# Patient Record
Sex: Male | Born: 1960 | Race: Black or African American | Hispanic: No | Smoking: Current every day smoker
Health system: Southern US, Community
[De-identification: ages and names within clinical notes are randomized; demographics above are authoritative.]

## PROBLEM LIST (undated history)

## (undated) DIAGNOSIS — G4730 Sleep apnea, unspecified: Secondary | ICD-10-CM

## (undated) DIAGNOSIS — F32A Depression, unspecified: Secondary | ICD-10-CM

## (undated) DIAGNOSIS — M1990 Unspecified osteoarthritis, unspecified site: Secondary | ICD-10-CM

## (undated) DIAGNOSIS — F419 Anxiety disorder, unspecified: Secondary | ICD-10-CM

## (undated) DIAGNOSIS — E785 Hyperlipidemia, unspecified: Secondary | ICD-10-CM

## (undated) DIAGNOSIS — I10 Essential (primary) hypertension: Secondary | ICD-10-CM

## (undated) DIAGNOSIS — K219 Gastro-esophageal reflux disease without esophagitis: Secondary | ICD-10-CM

## (undated) HISTORY — PX: COLONOSCOPY: SHX174

## (undated) HISTORY — DX: Essential (primary) hypertension: I10

## (undated) HISTORY — DX: Anxiety disorder, unspecified: F41.9

## (undated) HISTORY — DX: Depression, unspecified: F32.A

## (undated) HISTORY — DX: Unspecified osteoarthritis, unspecified site: M19.90

## (undated) HISTORY — DX: Sleep apnea, unspecified: G47.30

## (undated) HISTORY — DX: Gastro-esophageal reflux disease without esophagitis: K21.9

## (undated) HISTORY — DX: Hyperlipidemia, unspecified: E78.5

## (undated) HISTORY — PX: OTHER SURGICAL HISTORY: SHX169

---

## 2002-04-13 ENCOUNTER — Emergency Department (HOSPITAL_COMMUNITY): Admission: EM | Admit: 2002-04-13 | Discharge: 2002-04-13 | Payer: Self-pay | Admitting: Emergency Medicine

## 2004-11-18 ENCOUNTER — Emergency Department (HOSPITAL_COMMUNITY): Admission: EM | Admit: 2004-11-18 | Discharge: 2004-11-18 | Payer: Self-pay | Admitting: Emergency Medicine

## 2005-10-25 ENCOUNTER — Emergency Department (HOSPITAL_COMMUNITY): Admission: EM | Admit: 2005-10-25 | Discharge: 2005-10-25 | Payer: Self-pay | Admitting: Family Medicine

## 2008-10-17 ENCOUNTER — Emergency Department (HOSPITAL_COMMUNITY): Admission: EM | Admit: 2008-10-17 | Discharge: 2008-10-17 | Payer: Self-pay | Admitting: Emergency Medicine

## 2009-02-07 ENCOUNTER — Encounter: Admission: RE | Admit: 2009-02-07 | Discharge: 2009-02-07 | Payer: Self-pay | Admitting: Family Medicine

## 2009-02-23 ENCOUNTER — Encounter: Payer: Self-pay | Admitting: Internal Medicine

## 2009-02-23 ENCOUNTER — Ambulatory Visit (HOSPITAL_BASED_OUTPATIENT_CLINIC_OR_DEPARTMENT_OTHER): Admission: RE | Admit: 2009-02-23 | Discharge: 2009-02-23 | Payer: Self-pay | Admitting: Family Medicine

## 2009-02-26 ENCOUNTER — Ambulatory Visit: Payer: Self-pay | Admitting: Internal Medicine

## 2009-03-19 ENCOUNTER — Emergency Department (HOSPITAL_COMMUNITY): Admission: EM | Admit: 2009-03-19 | Discharge: 2009-03-19 | Payer: Self-pay | Admitting: Emergency Medicine

## 2009-05-20 ENCOUNTER — Ambulatory Visit: Payer: Self-pay | Admitting: Internal Medicine

## 2009-05-20 DIAGNOSIS — E785 Hyperlipidemia, unspecified: Secondary | ICD-10-CM | POA: Insufficient documentation

## 2009-05-20 DIAGNOSIS — G4733 Obstructive sleep apnea (adult) (pediatric): Secondary | ICD-10-CM | POA: Insufficient documentation

## 2009-05-20 DIAGNOSIS — I10 Essential (primary) hypertension: Secondary | ICD-10-CM | POA: Insufficient documentation

## 2009-09-06 ENCOUNTER — Ambulatory Visit: Payer: Self-pay | Admitting: Internal Medicine

## 2010-01-23 ENCOUNTER — Encounter: Admission: RE | Admit: 2010-01-23 | Discharge: 2010-03-28 | Payer: Self-pay | Admitting: Unknown Physician Specialty

## 2011-05-01 NOTE — Procedures (Signed)
NAME:  Scott Schaefer, Scott Schaefer             ACCOUNT NO.:  [id number]   MEDICAL RECORD NO.:  [id number]          PATIENT TYPE:  OUT   LOCATION:  SLEEP CENTER                 FACILITY:  [facility]   PHYSICIAN:  Clinton D. Maple Hudson, MD, FCCP, FACPDATE OF BIRTH:  November 09, 1962   DATE OF STUDY:  02/23/2009                            NOCTURNAL POLYSOMNOGRAM   REFERRING PHYSICIAN:  Maurice March, M.D.   REFERRING PHYSICIAN:  Maurice March, MD   INDICATION FOR STUDY:  Hypersomnia with sleep apnea.   EPWORTH SLEEPINESS SCORE:  16/24, BMI 31.8.  Weight 248 pounds, height  74 inches.  Neck 18 inches.   MEDICATIONS:  Home medications are charted and reviewed.   SLEEP ARCHITECTURE:  Total sleep time 303 minutes with sleep efficiency  78.8%.  Stage I was 10.1%, stage II 77.9%, stage III absent, REM 12% of  total sleep time.  Sleep latency 18 minutes, REM latency 157 minutes,  awake after sleep onset 61 minutes, arousal index 24.8.  No bedtime  medication was taken.   RESPIRATORY DATA:  Apnea/hypopnea index (AHI) 23.2 per hour.  A total of  117 events was scored including 23 obstructive apneas and 94 hypopneas.  Events were recorded in all sleep positions, most commonly while supine.  REM AHI of 51 per hour.  This was a diagnostic and PSG study and CPAP  titration was not done.   OXYGEN DATA:  Extremely loud snoring with oxygen desaturation to a nadir  of 81%.  Mean oxygen saturation through the study was 94.4% on room air.   CARDIAC DATA:  Normal sinus rhythm.   MOVEMENT-PARASOMNIA:  No significant movement disturbance.  Bathroom x2.   IMPRESSIONS-RECOMMENDATIONS:  1. Moderately severe obstructive sleep apnea/hypopnea syndrome,      apnea/hypopnea index 23.2 per hour with events more common while      supine but recorded in all positions.  Very loud snoring with      oxygen desaturation to a nadir of 81%.  2. This was a diagnostic and polysomnogram as ordered.  Consider      return for CPAP  titration or evaluate for alternative management as      appropriate.      Clinton D. Maple Hudson, MD, [facility], FACP  Diplomate, Biomedical engineer of Sleep Medicine  Electronically Signed     CDY/MEDQ  D:  02/27/2009 11:13:55  T:  02/28/2009 04:05:19  Job:  [id number]

## 2012-02-07 ENCOUNTER — Encounter: Payer: Self-pay | Admitting: Gastroenterology

## 2012-02-14 ENCOUNTER — Encounter: Payer: Self-pay | Admitting: Gastroenterology

## 2012-02-14 ENCOUNTER — Ambulatory Visit (AMBULATORY_SURGERY_CENTER): Payer: BC Managed Care – PPO | Admitting: *Deleted

## 2012-02-14 VITALS — Ht 74.0 in | Wt 248.1 lb

## 2012-02-14 DIAGNOSIS — Z1211 Encounter for screening for malignant neoplasm of colon: Secondary | ICD-10-CM

## 2012-02-14 MED ORDER — PEG-KCL-NACL-NASULF-NA ASC-C 100 G PO SOLR: ORAL | Status: DC

## 2012-02-27 ENCOUNTER — Encounter: Payer: Self-pay | Admitting: Gastroenterology

## 2012-02-27 ENCOUNTER — Ambulatory Visit (AMBULATORY_SURGERY_CENTER): Payer: BC Managed Care – PPO | Admitting: Gastroenterology

## 2012-02-27 VITALS — BP 157/101 | HR 90 | Temp 97.3°F | Resp 17

## 2012-02-27 DIAGNOSIS — Z1211 Encounter for screening for malignant neoplasm of colon: Secondary | ICD-10-CM

## 2012-02-27 LAB — GLUCOSE, CAPILLARY: Glucose-Capillary: 123 mg/dL — ABNORMAL HIGH (ref 70–99)

## 2012-02-27 MED ORDER — SODIUM CHLORIDE 0.9 % IV SOLN: 500.0000 mL | INTRAVENOUS | Status: DC

## 2012-02-27 NOTE — Patient Instructions (Signed)

## 2012-02-27 NOTE — Op Note (Signed)
[facility] [address] [place], [place]  [address]  COLONOSCOPY PROCEDURE REPORT  PATIENT:  Scott Schaefer, Scott Schaefer  MR#:  [id number] BIRTHDATE:  Nov 29, 1961, 51 yrs. old  GENDER:  male ENDOSCOPIST:  Vania Rea. Jarold Motto, MD, [facility] REF. BY: PROCEDURE DATE:  02/27/2012 PROCEDURE:  Incomplete colonoscopy ASA CLASS:  Class III INDICATIONS:  Routine Risk Screening MEDICATIONS:   propofol (Diprivan) 350 mg IV  DESCRIPTION OF PROCEDURE:   After the risks and benefits and of the procedure were explained, informed consent was obtained. Digital rectal exam was performed and revealed no abnormalities. The LB PCF-H180AL [id number] endoscope was introduced through the anus and advanced to the splenic flexure.  The quality of the prep was poor, using MoviPrep.  The instrument was then slowly withdrawn as the colon was fully examined. <<PROCEDUREIMAGES>>  FINDINGS:  incomplete exam. NO COLON PREP DONE.PROFUSE STOOL.IRRIGATED AT LENGTH.FOUL SMELLING FECES,CANNOT EVALUATE COLON !!!   Retroflexed views in the rectum revealed not done. The scope was then withdrawn from the patient and the procedure completed.  COMPLICATIONS:  None ENDOSCOPIC IMPRESSION: 1) Incomplete exam 2) Not done RECOMMENDATIONS: "DOUBLE PREP" AND REPEAT EXAM.  REPEAT EXAM:  No  ______________________________ Vania Rea. Jarold Motto, MD, [facility]  CC:  n. eSIGNED:   Vania Rea. Batya Citron at 02/27/2012 09:59 AM  Hortencia Conradi, [id number]

## 2012-02-27 NOTE — Progress Notes (Signed)
Patient did not experience any of the following events: a burn prior to discharge; a fall within the facility; wrong site/side/patient/procedure/implant event; or a hospital transfer or hospital admission upon discharge from the facility. (G8907) Patient did not have preoperative order for IV antibiotic SSI prophylaxis. (G8918)  

## 2012-02-28 ENCOUNTER — Telehealth: Payer: Self-pay | Admitting: *Deleted

## 2012-02-28 NOTE — Telephone Encounter (Signed)
Left message

## 2012-03-04 ENCOUNTER — Telehealth: Payer: Self-pay | Admitting: *Deleted

## 2012-03-04 NOTE — Telephone Encounter (Addendum)
lmom for pt to call back. Scheduled pt for PV on 03/10/12 at 0900am and for repeat Colon on 03/17/12 at 2:30pm. Mailed pt a letter with appts.

## 2012-03-17 ENCOUNTER — Encounter: Payer: BC Managed Care – PPO | Admitting: Gastroenterology

## 2012-04-23 ENCOUNTER — Ambulatory Visit (AMBULATORY_SURGERY_CENTER): Payer: BC Managed Care – PPO | Admitting: *Deleted

## 2012-04-23 ENCOUNTER — Encounter: Payer: Self-pay | Admitting: Gastroenterology

## 2012-04-23 VITALS — Ht 74.0 in | Wt 246.2 lb

## 2012-04-23 DIAGNOSIS — Z1211 Encounter for screening for malignant neoplasm of colon: Secondary | ICD-10-CM

## 2012-04-23 MED ORDER — PEG-KCL-NACL-NASULF-NA ASC-C 100 G PO SOLR: 1.0000 | Freq: Once | ORAL | Status: DC

## 2012-05-05 ENCOUNTER — Encounter: Payer: Self-pay | Admitting: Gastroenterology

## 2012-05-05 ENCOUNTER — Ambulatory Visit (AMBULATORY_SURGERY_CENTER): Payer: BC Managed Care – PPO | Admitting: Gastroenterology

## 2012-05-05 ENCOUNTER — Emergency Department (HOSPITAL_COMMUNITY)
Admission: EM | Admit: 2012-05-05 | Discharge: 2012-05-05 | Disposition: A | Discharge status: Home / Self Care | Payer: BC Managed Care – PPO | Source: Home / Self Care

## 2012-05-05 ENCOUNTER — Encounter (HOSPITAL_COMMUNITY): Payer: Self-pay | Admitting: Emergency Medicine

## 2012-05-05 VITALS — BP 175/117 | HR 129 | Temp 96.3°F | Resp 22 | Ht 74.0 in | Wt 246.0 lb

## 2012-05-05 DIAGNOSIS — D126 Benign neoplasm of colon, unspecified: Secondary | ICD-10-CM

## 2012-05-05 DIAGNOSIS — Z1211 Encounter for screening for malignant neoplasm of colon: Secondary | ICD-10-CM

## 2012-05-05 DIAGNOSIS — M109 Gout, unspecified: Secondary | ICD-10-CM

## 2012-05-05 LAB — GLUCOSE, CAPILLARY: Glucose-Capillary: 148 mg/dL — ABNORMAL HIGH (ref 70–99)

## 2012-05-05 MED ORDER — COLCHICINE 0.6 MG PO TABS: 0.6000 mg | ORAL_TABLET | Freq: Every day | ORAL | Status: DC | PRN

## 2012-05-05 MED ORDER — SODIUM CHLORIDE 0.9 % IV SOLN: 500.0000 mL | INTRAVENOUS | Status: DC

## 2012-05-05 MED ORDER — HYDROCODONE-ACETAMINOPHEN 5-500 MG PO TABS: 1.0000 | ORAL_TABLET | Freq: Four times a day (QID) | ORAL | Status: AC | PRN

## 2012-05-05 NOTE — Progress Notes (Signed)
Propofol per [facility]. See scanned intra procedure report. ewm 

## 2012-05-05 NOTE — Patient Instructions (Signed)
Discharge instructions given with verbal understanding. Handouts on polyps given. Resume previous medications. YOU HAD AN ENDOSCOPIC PROCEDURE TODAY AT THE  ENDOSCOPY CENTER: Refer to the procedure report that was given to you for any specific questions about what was found during the examination.  If the procedure report does not answer your questions, please call your gastroenterologist to clarify.  If you requested that your care partner not be given the details of your procedure findings, then the procedure report has been included in a sealed envelope for you to review at your convenience later.  YOU SHOULD EXPECT: Some feelings of bloating in the abdomen. Passage of more gas than usual.  Walking can help get rid of the air that was put into your GI tract during the procedure and reduce the bloating. If you had a lower endoscopy (such as a colonoscopy or flexible sigmoidoscopy) you may notice spotting of blood in your stool or on the toilet paper. If you underwent a bowel prep for your procedure, then you may not have a normal bowel movement for a few days.  DIET: Your first meal following the procedure should be a light meal and then it is ok to progress to your normal diet.  A half-sandwich or bowl of soup is an example of a good first meal.  Heavy or fried foods are harder to digest and may make you feel nauseous or bloated.  Likewise meals heavy in dairy and vegetables can cause extra gas to form and this can also increase the bloating.  Drink plenty of fluids but you should avoid alcoholic beverages for 24 hours.  ACTIVITY: Your care partner should take you home directly after the procedure.  You should plan to take it easy, moving slowly for the rest of the day.  You can resume normal activity the day after the procedure however you should NOT DRIVE or use heavy machinery for 24 hours (because of the sedation medicines used during the test).    SYMPTOMS TO REPORT IMMEDIATELY: A  gastroenterologist can be reached at any hour.  During normal business hours, 8:30 AM to 5:00 PM Monday through Friday, call [phone].  After hours and on weekends, please call the GI answering service at [phone] who will take a message and have the physician on call contact you.   Following lower endoscopy (colonoscopy or flexible sigmoidoscopy):  Excessive amounts of blood in the stool  Significant tenderness or worsening of abdominal pains  Swelling of the abdomen that is new, acute  Fever of 100F or higher  FOLLOW UP: If any biopsies were taken you will be contacted by phone or by letter within the next 1-3 weeks.  Call your gastroenterologist if you have not heard about the biopsies in 3 weeks.  Our staff will call the home number listed on your records the next business day following your procedure to check on you and address any questions or concerns that you may have at that time regarding the information given to you following your procedure. This is a courtesy call and so if there is no answer at the home number and we have not heard from you through the emergency physician on call, we will assume that you have returned to your regular daily activities without incident.  SIGNATURES/CONFIDENTIALITY: You and/or your care partner have signed paperwork which will be entered into your electronic medical record.  These signatures attest to the fact that that the information above on your After Visit Summary has   been reviewed and is understood.  Full responsibility of the confidentiality of this discharge information lies with you and/or your care-partner.  

## 2012-05-05 NOTE — ED Notes (Signed)
PT HERE WITH C/O SWELLING AND THROB PAIN IN RIGHT GREAT TOE THAT STARTED Sunday.DIFF WALKING AND STANDING

## 2012-05-05 NOTE — Op Note (Signed)
[facility] [address] [place], [place]  [address]  COLONOSCOPY PROCEDURE REPORT  PATIENT:  Scott Schaefer, Scott Schaefer  MR#:  [id number] BIRTHDATE:  1961/01/24, 51 yrs. old  GENDER:  male ENDOSCOPIST:  Vania Rea. Jarold Motto, MD, [facility] REF. BY: PROCEDURE DATE:  05/05/2012 PROCEDURE:  Colonoscopy with biopsy ASA CLASS:  Class II INDICATIONS:  Routine Risk Screening MEDICATIONS:   propofol (Diprivan) 330 mg IV  DESCRIPTION OF PROCEDURE:   After the risks and benefits and of the procedure were explained, informed consent was obtained. Digital rectal exam was performed and revealed no abnormalities. The LB CF-H180AL [id number] endoscope was introduced through the anus and advanced to the cecum, which was identified by both the appendix and ileocecal valve.  The quality of the prep was good, using MoviPrep.  The instrument was then slowly withdrawn as the colon was fully examined. <<PROCEDUREIMAGES>>  FINDINGS:  A diminutive polyp was found in the sigmoid colon. 3 MM POLYP COLD BIOPSY REMOVER.SEE PICTURES  This was otherwise a normal examination of the colon.   Retroflexed views in the rectum revealed no abnormalities.    The scope was then withdrawn from the patient and the procedure completed.  COMPLICATIONS:  None ENDOSCOPIC IMPRESSION: 1) Diminutive polyp in the sigmoid colon 2) Otherwise normal examination R/O ADENOMA RECOMMENDATIONS: 1) Repeat colonoscopy in 5 years if polyp adenomatous; otherwise 10 years  REPEAT EXAM:  No  ______________________________ Vania Rea. Jarold Motto, MD, [facility]  CC:  n. eSIGNED:   Vania Rea. Emori Mumme at 05/05/2012 09:30 AM  Hortencia Conradi, [id number]

## 2012-05-05 NOTE — Progress Notes (Signed)
Patient did not experience any of the following events: a burn prior to discharge; a fall within the facility; wrong site/side/patient/procedure/implant event; or a hospital transfer or hospital admission upon discharge from the facility. (G8907) Patient did not have preoperative order for IV antibiotic SSI prophylaxis. (G8918)  

## 2012-05-05 NOTE — ED Provider Notes (Signed)
Scott Schaefer is a 51 y.o. male who presents to Urgent Care today for right big toe pain.  Patient noted acute onset of right great toe pain at the metatarsophalangeal joint starting yesterday evening. In the history he has had pain on and off in the same joint.  He denies any fevers chills or injury to the joint.  He feels well otherwise.  He has not tried any medicines yet for this pain.  He has not been diagnosed with gout in the past.  Of note he had a colonoscopy this morning and did not take his blood pressure pill.   PMH reviewed. Significant for hypertension and diabetes History  Substance Use Topics  . Smoking status: Current Everyday Smoker -- 0.8 packs/day    Types: Cigarettes  . Smokeless tobacco: Never Used  . Alcohol Use: 1.2 oz/week    2 Cans of beer per week   ROS as above Medications reviewed. Current Facility-Administered Medications  Medication Dose Route Frequency Provider Last Rate Last Dose  . DISCONTD: 0.9 %  sodium chloride infusion  500 mL Intravenous Continuous Mardella Layman, MD       Current Outpatient Prescriptions  Medication Sig Dispense Refill  . ACCU-CHEK AVIVA PLUS test strip       . ALPRAZolam (XANAX) 1 MG tablet Take 1 mg by mouth 3 (three) times daily as needed.       . colchicine 0.6 MG tablet Take 1 tablet (0.6 mg total) by mouth daily as needed (pain).  30 tablet  0  . HYDROcodone-acetaminophen (VICODIN) 5-500 MG per tablet Take 1 tablet by mouth every 6 (six) hours as needed for pain.  30 tablet  0  . lisinopril-hydrochlorothiazide (PRINZIDE,ZESTORETIC) 20-25 MG per tablet Take 1 tablet by mouth daily.       . metFORMIN (GLUCOPHAGE) 500 MG tablet Take 500 mg by mouth 2 (two) times daily with a meal.       . PARoxetine (PAXIL) 20 MG tablet Take 20 mg by mouth every morning.         Exam:  BP 152/73  Pulse 86  Temp(Src) 97.8 F (36.6 C) (Oral)  Resp 18  SpO2 100% Gen: Well NAD RIGHT FOOT: Perfused right great toe metatarsal-phalangeal  joint. Tender to touch and motion.  Normal sensation and capillary refill.  Results for orders placed in visit on 05/05/12 (from the past 24 hour(s))  GLUCOSE, CAPILLARY     Status: Abnormal   Collection Time   05/05/12  8:00 AM      Component Value Range   Glucose-Capillary 124 (*) 70 - 99 (mg/dL)  GLUCOSE, CAPILLARY     Status: Abnormal   Collection Time   05/05/12  9:30 AM      Component Value Range   Glucose-Capillary 148 (*) 70 - 99 (mg/dL)   Comment 1 Notify RN     No results found.  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 I feel this pain is almost certainly due to gout in a patient with hypertension and diabetes.  Plan to treat with culture seen in followup with primary care doctor.  Additionally provided hydrocodone for temporary pain relief as well.  I also provided a handout on gout and precautions.  He expresses understand.     Rodolph Bong, MD 05/05/12 [phone]

## 2012-05-05 NOTE — Discharge Instructions (Signed)
Thank you for coming in today. I think you have gout.  Please get colchicine. Take 2 pills tonight and one pill daily for one week.  Please follow up with your doctor in about a week.  \Let us know if you do not get better  Gout Gout is an inflammatory condition (arthritis) caused by a buildup of uric acid crystals in the joints. Uric acid is a chemical that is normally present in the blood. Under some circumstances, uric acid can form into crystals in your joints. This causes joint redness, soreness, and swelling (inflammation). Repeat attacks are common. Over time, uric acid crystals can form into masses (tophi) near a joint, causing disfigurement. Gout is treatable and often preventable. CAUSES  The disease begins with elevated levels of uric acid in the blood. Uric acid is produced by your body when it breaks down a naturally found substance called purines. This also happens when you eat certain foods such as meats and fish. Causes of an elevated uric acid level include:  Being passed down from parent to child (heredity).   Diseases that cause increased uric acid production (obesity, psoriasis, some cancers).   Excessive alcohol use.   Diet, especially diets rich in meat and seafood.   Medicines, including certain cancer-fighting drugs (chemotherapy), diuretics, and aspirin.   Chronic kidney disease. The kidneys are no longer able to remove uric acid well.   Problems with metabolism.  Conditions strongly associated with gout include:  Obesity.   High blood pressure.   High cholesterol.   Diabetes.  Not everyone with elevated uric acid levels gets gout. It is not understood why some people get gout and others do not. Surgery, joint injury, and eating too much of certain foods are some of the factors that can lead to gout. SYMPTOMS   An attack of gout comes on quickly. It causes intense pain with redness, swelling, and warmth in a joint.   Fever can occur.   Often, only one  joint is involved. Certain joints are more commonly involved:   Base of the big toe.   Knee.   Ankle.   Wrist.   Finger.  Without treatment, an attack usually goes away in a few days to weeks. Between attacks, you usually will not have symptoms, which is different from many other forms of arthritis. DIAGNOSIS  Your caregiver will suspect gout based on your symptoms and exam. Removal of fluid from the joint (arthrocentesis) is done to check for uric acid crystals. Your caregiver will give you a medicine that numbs the area (local anesthetic) and use a needle to remove joint fluid for exam. Gout is confirmed when uric acid crystals are seen in joint fluid, using a special microscope. Sometimes, blood, urine, and X-ray tests are also used. TREATMENT  There are 2 phases to gout treatment: treating the sudden onset (acute) attack and preventing attacks (prophylaxis). Treatment of an Acute Attack  Medicines are used. These include anti-inflammatory medicines or steroid medicines.   An injection of steroid medicine into the affected joint is sometimes necessary.   The painful joint is rested. Movement can worsen the arthritis.   You may use warm or cold treatments on painful joints, depending which works best for you.   Discuss the use of coffee, vitamin C, or cherries with your caregiver. These may be helpful treatment options.  Treatment to Prevent Attacks After the acute attack subsides, your caregiver may advise prophylactic medicine. These medicines either help your kidneys eliminate uric acid  from your body or decrease your uric acid production. You may need to stay on these medicines for a very long time. The early phase of treatment with prophylactic medicine can be associated with an increase in acute gout attacks. For this reason, during the first few months of treatment, your caregiver may also advise you to take medicines usually used for acute gout treatment. Be sure you understand  your caregiver's directions. You should also discuss dietary treatment with your caregiver. Certain foods such as meats and fish can increase uric acid levels. Other foods such as dairy can decrease levels. Your caregiver can give you a list of foods to avoid. HOME CARE INSTRUCTIONS   Do not take aspirin to relieve pain. This raises uric acid levels.   Only take over-the-counter or prescription medicines for pain, discomfort, or fever as directed by your caregiver.   Rest the joint as much as possible. When in bed, keep sheets and blankets off painful areas.   Keep the affected joint raised (elevated).   Use crutches if the painful joint is in your leg.   Drink enough water and fluids to keep your urine clear or pale yellow. This helps your body get rid of uric acid. Do not drink alcoholic beverages. They slow the passage of uric acid.   Follow your caregiver's dietary instructions. Pay careful attention to the amount of protein you eat. Your daily diet should emphasize fruits, vegetables, whole grains, and fat-free or low-fat milk products.   Maintain a healthy body weight.  SEEK MEDICAL CARE IF:   You have an oral temperature above 102 F (38.9 C).   You develop diarrhea, vomiting, or any side effects from medicines.   You do not feel better in 24 hours, or you are getting worse.  SEEK IMMEDIATE MEDICAL CARE IF:   Your joint becomes suddenly more tender and you have:   Chills.   An oral temperature above 102 F (38.9 C), not controlled by medicine.  MAKE SURE YOU:   Understand these instructions.   Will watch your condition.   Will get help right away if you are not doing well or get worse.  Document Released: 11/30/2000 Document Revised: 11/22/2011 Document Reviewed: 03/13/2010 [facility] Patient Information 2012 [place], [place].

## 2012-05-06 ENCOUNTER — Telehealth: Payer: Self-pay

## 2012-05-06 NOTE — Telephone Encounter (Signed)
  Follow up Call-  Call back number 05/05/2012 02/27/2012  Post procedure Call Back phone  # [phone]  Permission to leave phone message Yes Yes     Patient questions:  Do you have a fever, pain , or abdominal swelling? no Pain Score  0 *  Have you tolerated food without any problems? yes  Have you been able to return to your normal activities? yes  Do you have any questions about your discharge instructions: Diet   no Medications  no Follow up visit  no  Do you have questions or concerns about your Care? no  Actions: * If pain score is 4 or above: No action needed, pain <4.

## 2012-05-08 ENCOUNTER — Encounter: Payer: Self-pay | Admitting: Gastroenterology

## 2012-05-29 NOTE — ED Provider Notes (Signed)
Medical screening examination/treatment/procedure(s) were performed by resident physician or non-physician practitioner and as supervising physician I was immediately available for consultation/collaboration.   Barkley Bruns MD.    Linna Hoff, MD 05/29/12 [phone]

## 2013-03-14 ENCOUNTER — Ambulatory Visit (INDEPENDENT_AMBULATORY_CARE_PROVIDER_SITE_OTHER): Payer: BC Managed Care – PPO | Admitting: Family Medicine

## 2013-03-14 VITALS — BP 147/81 | HR 98 | Temp 98.0°F | Resp 20 | Ht 73.0 in | Wt 247.0 lb

## 2013-03-14 DIAGNOSIS — M79609 Pain in unspecified limb: Secondary | ICD-10-CM

## 2013-03-14 DIAGNOSIS — L0501 Pilonidal cyst with abscess: Secondary | ICD-10-CM

## 2013-03-14 MED ORDER — HYDROCODONE-ACETAMINOPHEN 5-325 MG PO TABS: 1.0000 | ORAL_TABLET | Freq: Four times a day (QID) | ORAL | Status: DC | PRN

## 2013-03-14 MED ORDER — CIPROFLOXACIN HCL 500 MG PO TABS: 500.0000 mg | ORAL_TABLET | Freq: Two times a day (BID) | ORAL | Status: DC

## 2013-03-14 NOTE — Progress Notes (Signed)
52 yo with a recurrence of a pilonidal abscess, now present on left upper gluteal cleft for 3 days and worsening with pain.  Taking hot showers, but no discharge  No fever or chills  Objective:  NAD 1 cm left upper gluteal cleft abscess. 1% xylo local after betadine prep I&D'd with copious malodorous pus expressed Packed with iodoform Assessment:  Pilonidal abscess.  Plan:  Follow up Monday Pilonidal abscess - Plan: ciprofloxacin (CIPRO) 500 MG tablet, HYDROcodone-acetaminophen (NORCO) 5-325 MG per tablet, Wound culture  Pilonidal abscess - Plan: ciprofloxacin (CIPRO) 500 MG tablet, HYDROcodone-acetaminophen (NORCO) 5-325 MG per tablet, Wound culture

## 2013-03-14 NOTE — Patient Instructions (Addendum)
Follow up Monday   Pilonidal Cyst A pilonidal cyst occurs when hairs get trapped (ingrown) beneath the skin in the crease between the buttocks over your sacrum (the bone under that crease). Pilonidal cysts are most common in young men with a lot of body hair. When the cyst is ruptured (breaks) or leaking, fluid from the cyst may cause burning and itching. If the cyst becomes infected, it causes a painful swelling filled with pus (abscess). The pus and trapped hairs need to be removed (often by lancing) so that the infection can heal. However, recurrence is common and an operation may be needed to remove the cyst. HOME CARE INSTRUCTIONS   If the cyst was NOT INFECTED:  Keep the area clean and dry. Bathe or shower daily. Wash the area well with a germ-killing soap. Warm tub baths may help prevent infection and help with drainage. Dry the area well with a towel.  Avoid tight clothing to keep area as moisture free as possible.  Keep area between buttocks as free of hair as possible. A depilatory may be used.  If the cyst WAS INFECTED and needed to be drained:  Your caregiver packed the wound with gauze to keep the wound open. This allows the wound to heal from the inside outwards and continue draining.  Return for a wound check in 1 day or as suggested.  If you take tub baths or showers, repack the wound with gauze following them. Sponge baths (at the sink) are a good alternative.  If an antibiotic was ordered to fight the infection, take as directed.  Only take over-the-counter or prescription medicines for pain, discomfort, or fever as directed by your caregiver.  After the drain is removed, use sitz baths for 20 minutes 4 times per day. Clean the wound gently with mild unscented soap, pat dry, and then apply a dry dressing. SEEK MEDICAL CARE IF:   You have increased pain, swelling, redness, drainage, or bleeding from the area.  You have a fever.  You have muscles aches, dizziness, or  a general ill feeling. Document Released: 11/30/2000 Document Revised: 02/25/2012 Document Reviewed: 01/28/2009 [facility] Patient Information 2013 [place], [place].

## 2013-03-18 LAB — WOUND CULTURE: Gram Stain: NONE SEEN

## 2015-10-16 ENCOUNTER — Observation Stay (HOSPITAL_COMMUNITY)
Admission: EM | Admit: 2015-10-16 | Discharge: 2015-10-17 | Disposition: A | Discharge status: Home / Self Care | Payer: BLUE CROSS/BLUE SHIELD | Attending: Internal Medicine | Admitting: Internal Medicine

## 2015-10-16 ENCOUNTER — Encounter (HOSPITAL_COMMUNITY): Payer: Self-pay | Admitting: Emergency Medicine

## 2015-10-16 DIAGNOSIS — F329 Major depressive disorder, single episode, unspecified: Secondary | ICD-10-CM | POA: Insufficient documentation

## 2015-10-16 DIAGNOSIS — E119 Type 2 diabetes mellitus without complications: Secondary | ICD-10-CM | POA: Insufficient documentation

## 2015-10-16 DIAGNOSIS — Y9389 Activity, other specified: Secondary | ICD-10-CM | POA: Diagnosis not present

## 2015-10-16 DIAGNOSIS — Z792 Long term (current) use of antibiotics: Secondary | ICD-10-CM | POA: Insufficient documentation

## 2015-10-16 DIAGNOSIS — Z79899 Other long term (current) drug therapy: Secondary | ICD-10-CM | POA: Insufficient documentation

## 2015-10-16 DIAGNOSIS — X58XXXA Exposure to other specified factors, initial encounter: Secondary | ICD-10-CM | POA: Insufficient documentation

## 2015-10-16 DIAGNOSIS — Z720 Tobacco use: Secondary | ICD-10-CM | POA: Diagnosis not present

## 2015-10-16 DIAGNOSIS — I10 Essential (primary) hypertension: Secondary | ICD-10-CM | POA: Diagnosis not present

## 2015-10-16 DIAGNOSIS — F419 Anxiety disorder, unspecified: Secondary | ICD-10-CM | POA: Diagnosis not present

## 2015-10-16 DIAGNOSIS — G4730 Sleep apnea, unspecified: Secondary | ICD-10-CM | POA: Insufficient documentation

## 2015-10-16 DIAGNOSIS — Z9981 Dependence on supplemental oxygen: Secondary | ICD-10-CM | POA: Insufficient documentation

## 2015-10-16 DIAGNOSIS — T783XXA Angioneurotic edema, initial encounter: Secondary | ICD-10-CM | POA: Diagnosis not present

## 2015-10-16 DIAGNOSIS — Y9289 Other specified places as the place of occurrence of the external cause: Secondary | ICD-10-CM | POA: Diagnosis not present

## 2015-10-16 DIAGNOSIS — Y998 Other external cause status: Secondary | ICD-10-CM | POA: Insufficient documentation

## 2015-10-16 DIAGNOSIS — T7840XA Allergy, unspecified, initial encounter: Secondary | ICD-10-CM | POA: Diagnosis present

## 2015-10-16 LAB — GLUCOSE, CAPILLARY
GLUCOSE-CAPILLARY: 129 mg/dL — AB (ref 65–99)
GLUCOSE-CAPILLARY: 192 mg/dL — AB (ref 65–99)
GLUCOSE-CAPILLARY: 137 mg/dL — AB (ref 65–99)

## 2015-10-16 LAB — CBC
HEMATOCRIT: 40.9 % (ref 39.0–52.0)
Hemoglobin: 14.3 g/dL (ref 13.0–17.0)
MCH: 28.4 pg (ref 26.0–34.0)
MCHC: 35 g/dL (ref 30.0–36.0)
MCV: 81.3 fL (ref 78.0–100.0)
Platelets: 245 10*3/uL (ref 150–400)
RBC: 5.03 MIL/uL (ref 4.22–5.81)
RDW: 14.5 % (ref 11.5–15.5)
WBC: 6.9 10*3/uL (ref 4.0–10.5)

## 2015-10-16 LAB — I-STAT CHEM 8, ED
BUN: 14 mg/dL (ref 6–20)
CREATININE: 1.1 mg/dL (ref 0.61–1.24)
Calcium, Ion: 1.11 mmol/L — ABNORMAL LOW (ref 1.12–1.23)
Chloride: 96 mmol/L — ABNORMAL LOW (ref 101–111)
Glucose, Bld: 166 mg/dL — ABNORMAL HIGH (ref 65–99)
HEMATOCRIT: 48 % (ref 39.0–52.0)
HEMOGLOBIN: 16.3 g/dL (ref 13.0–17.0)
POTASSIUM: 4.2 mmol/L (ref 3.5–5.1)
SODIUM: 135 mmol/L (ref 135–145)
TCO2: 25 mmol/L (ref 0–100)

## 2015-10-16 LAB — TSH: TSH: 0.706 u[IU]/mL (ref 0.350–4.500)

## 2015-10-16 LAB — C-REACTIVE PROTEIN: CRP: 1 mg/dL — AB (ref <1.0)

## 2015-10-16 LAB — CREATININE, SERUM
Creatinine, Ser: 1.17 mg/dL (ref 0.61–1.24)
GFR calc non Af Amer: >60 mL/min (ref >60)

## 2015-10-16 LAB — SEDIMENTATION RATE: SED RATE: 7 mm/h (ref 0–16)

## 2015-10-16 MED ORDER — DIPHENHYDRAMINE HCL 50 MG/ML IJ SOLN: 25.0000 mg | Freq: Four times a day (QID) | INTRAMUSCULAR | Status: DC | PRN

## 2015-10-16 MED ORDER — ENOXAPARIN SODIUM 40 MG/0.4ML ~~LOC~~ SOLN
40.0000 mg | SUBCUTANEOUS | Status: DC
  Administered 2015-10-16: 40 mg via SUBCUTANEOUS
  Filled 2015-10-16: qty 0.4

## 2015-10-16 MED ORDER — SODIUM CHLORIDE 0.9 % IV SOLN
INTRAVENOUS | Status: AC
  Administered 2015-10-16: 12:00:00 via INTRAVENOUS

## 2015-10-16 MED ORDER — MORPHINE SULFATE (PF) 2 MG/ML IV SOLN: 2.0000 mg | INTRAVENOUS | Status: DC | PRN

## 2015-10-16 MED ORDER — FAMOTIDINE IN NACL 20-0.9 MG/50ML-% IV SOLN
20.0000 mg | Freq: Once | INTRAVENOUS | Status: AC
  Administered 2015-10-16: 20 mg via INTRAVENOUS
  Filled 2015-10-16: qty 50

## 2015-10-16 MED ORDER — EPINEPHRINE HCL 1 MG/ML IJ SOLN
0.3000 mg | Freq: Once | INTRAMUSCULAR | Status: AC
  Administered 2015-10-16: 0.3 mg via INTRAMUSCULAR
  Filled 2015-10-16: qty 1

## 2015-10-16 MED ORDER — BISACODYL 10 MG RE SUPP: 10.0000 mg | Freq: Every day | RECTAL | Status: DC | PRN

## 2015-10-16 MED ORDER — FAMOTIDINE IN NACL 20-0.9 MG/50ML-% IV SOLN
20.0000 mg | Freq: Two times a day (BID) | INTRAVENOUS | Status: DC
  Administered 2015-10-16 – 2015-10-17 (×3): 20 mg via INTRAVENOUS
  Filled 2015-10-16 (×4): qty 50

## 2015-10-16 MED ORDER — METHYLPREDNISOLONE SODIUM SUCC 125 MG IJ SOLR
125.0000 mg | Freq: Once | INTRAMUSCULAR | Status: AC
  Administered 2015-10-16: 125 mg via INTRAVENOUS
  Filled 2015-10-16: qty 2

## 2015-10-16 MED ORDER — INSULIN ASPART 100 UNIT/ML ~~LOC~~ SOLN: 0.0000 [IU] | Freq: Every day | SUBCUTANEOUS | Status: DC

## 2015-10-16 MED ORDER — DIPHENHYDRAMINE HCL 50 MG/ML IJ SOLN
50.0000 mg | Freq: Once | INTRAMUSCULAR | Status: AC
  Administered 2015-10-16: 50 mg via INTRAVENOUS
  Filled 2015-10-16: qty 1

## 2015-10-16 MED ORDER — LORAZEPAM 2 MG/ML IJ SOLN: 0.5000 mg | INTRAMUSCULAR | Status: DC | PRN

## 2015-10-16 MED ORDER — INSULIN ASPART 100 UNIT/ML ~~LOC~~ SOLN
0.0000 [IU] | Freq: Three times a day (TID) | SUBCUTANEOUS | Status: DC
  Administered 2015-10-16 – 2015-10-17 (×2): 2 [IU] via SUBCUTANEOUS

## 2015-10-16 MED ORDER — METHYLPREDNISOLONE SODIUM SUCC 125 MG IJ SOLR
60.0000 mg | Freq: Two times a day (BID) | INTRAMUSCULAR | Status: DC
  Administered 2015-10-16: 60 mg via INTRAVENOUS
  Administered 2015-10-16: 62.5 mg via INTRAVENOUS
  Filled 2015-10-16 (×2): qty 2

## 2015-10-16 MED ORDER — SODIUM CHLORIDE 0.9 % IJ SOLN
3.0000 mL | Freq: Two times a day (BID) | INTRAMUSCULAR | Status: DC
  Administered 2015-10-16 – 2015-10-17 (×2): 3 mL via INTRAVENOUS

## 2015-10-16 MED ORDER — METHYLPREDNISOLONE SODIUM SUCC 125 MG IJ SOLR: 60.0000 mg | Freq: Two times a day (BID) | INTRAMUSCULAR | Status: DC

## 2015-10-16 MED ORDER — ONDANSETRON HCL 4 MG/2ML IJ SOLN: 4.0000 mg | Freq: Four times a day (QID) | INTRAMUSCULAR | Status: DC | PRN

## 2015-10-16 NOTE — H&P (Signed)
[facility] History and Physical  [place] RWE:[id number] DOB: [birth year] DOA: 10/16/2015  Referring physician: Dr. Oleta Mouse PCP: Dr. Leola Brazil at the patient's place of employment: [employer].   Chief Complaint: Swelling of lips and tongue  HPI: Scott Schaefer is a 54 y.o. male with a past medical history significant for hypertension, the patient is maintained on a combination of ACE inhibitor and a diuretic for the past few years, history of diabetes mellitus 4 which the patient is on metformin. The patient states he has recently been diagnosed with generalized anxiety disorder and has been started on Xanax as well as Paxil. The patient has been diagnosed with obstructive sleep apnea but states that he is not able to be compliant with use of his CPAP machine.  The patient states he was out at a retreat yesterday. He had some fish fried in canola oil. Patient's wife states that about a year ago the patient developed swelling of his lips that subsided without any medical intervention in 24 hours. The patient woke his wife up at around 4 AM on 10-16-15 because his face was numb and his lips were swollen. Patient did not have a rash patient did not have any wheezing patient did not have any chest tightness, patient did not have any abdominal pain, patient did not have any shortness of breath, patient denies any fevers.   The patient's wife became concerned and brought him into the emergency department. In the emergency department, the patient was given Solu-Medrol, Pepcid was also given epinephrine. Patient is awake alert able to talk able to manage his secretions however continues to have swollen lips and swollen tongue.  Hospitalist admission was requested for observation.     Review of Systems:  Constitutional:  No weight loss, night sweats, Fevers, chills, fatigue.  HEENT:  No headaches, Difficulty swallowing,Tooth/dental problems,Sore throat,  No sneezing, itching, ear ache, nasal  congestion, post nasal drip,  Cardio-vascular:  No chest pain, Orthopnea, PND, swelling in lower extremities, anasarca, dizziness, palpitations  GI:  No heartburn, indigestion, abdominal pain, nausea, vomiting, diarrhea, change in bowel habits, loss of appetite  Resp:  No shortness of breath with exertion or at rest. No excess mucus, no productive cough, No non-productive cough, No coughing up of blood.No change in color of mucus.No wheezing.No chest wall deformity  Skin:  no rash or lesions.  GU:  no dysuria, change in color of urine, no urgency or frequency. No flank pain.  Musculoskeletal:  No joint pain or swelling. No decreased range of motion. No back pain.  Psych:  No change in mood or affect. Pertinent for anxiety. No memory loss.   Past Medical History  Diagnosis Date  . Anxiety   . Diabetes mellitus   . Hypertension   . Sleep apnea     has cpap but does not use   Past Surgical History  Procedure Laterality Date  . No prior surgeries     Social History:  reports that he has been smoking Cigarettes.  He has been smoking about 0.80 packs per day. He has never used smokeless tobacco. He reports that he drinks about 1.2 oz of alcohol per week. He reports that he does not use illicit drugs. Patient is married lives with his wife he has 2 children, works at [employer] No Known Allergies  Family History  Problem Relation Age of Onset  . Colon cancer Neg Hx   . Stomach cancer Neg Hx   . Hypertension Sister   . Hypertension Brother   .  Diabetes Maternal Grandmother    patient's mother is alive, father is deceased, history of diabetes and hypertension in various family members  Prior to Admission medications   Medication Sig Start Date End Date Taking? Authorizing Provider  ALPRAZolam Duanne Moron) 1 MG tablet Take 1 mg by mouth 3 (three) times daily as needed for anxiety.  02/03/12  Yes Historical Provider, MD  lisinopril-hydrochlorothiazide (PRINZIDE,ZESTORETIC) 20-25 MG per  tablet Take 1 tablet by mouth daily.  11/21/11  Yes Historical Provider, MD  metFORMIN (GLUCOPHAGE) 500 MG tablet Take 500 mg by mouth 2 (two) times daily with a meal.  12/07/11  Yes Historical Provider, MD  PARoxetine (PAXIL) 20 MG tablet Take 20 mg by mouth every morning.  01/29/12  Yes Historical Provider, MD   Physical Exam: Filed Vitals:   10/16/15 0830 10/16/15 0845 10/16/15 0915 10/16/15 1000  BP: 143/88 117/98 147/87 148/77  Pulse: 85 84 85 84  Temp:      TempSrc:      Resp: 28 18 18 23   Height:      Weight:      SpO2: 95% 95% 95% 96%    Wt Readings from Last 3 Encounters:  10/16/15 105.745 kg (233 lb 2 oz)  03/14/13 112.038 kg (247 lb)  05/05/12 111.585 kg (246 lb)    General:  Appears calm and comfortable Eyes: PERRL, normal lids, irises & conjunctiva ENT: Swollen lips & tongue Neck: no LAD, masses or thyromegaly Cardiovascular: RRR, no m/r/g. No LE edema. Telemetry: SR, no arrhythmias  Respiratory: CTA bilaterally, no w/r/r. Normal respiratory effort. Abdomen: soft, ntnd Skin: no rash or induration seen on limited exam Musculoskeletal: grossly normal tone BUE/BLE Psychiatric: grossly normal mood and affect, speech fluent and appropriate Neurologic: grossly non-focal.          Labs on Admission:  Basic Metabolic Panel:  Recent Labs Lab 10/16/15 0907  NA 135  K 4.2  CL 96*  GLUCOSE 166*  BUN 14  CREATININE 1.10   Liver Function Tests: No results for input(s): AST, ALT, ALKPHOS, BILITOT, PROT, ALBUMIN in the last 168 hours. No results for input(s): LIPASE, AMYLASE in the last 168 hours. No results for input(s): AMMONIA in the last 168 hours. CBC:  Recent Labs Lab 10/16/15 0907  HGB 16.3  HCT 48.0   Cardiac Enzymes: No results for input(s): CKTOTAL, CKMB, CKMBINDEX, TROPONINI in the last 168 hours.  BNP (last 3 results) No results for input(s): BNP in the last 8760 hours.  ProBNP (last 3 results) No results for input(s): PROBNP in the last  8760 hours.  CBG: No results for input(s): GLUCAP in the last 168 hours.  Radiological Exams on Admission: No results found.  EKG: Independently reviewed.  Ordered pending  Assessment/Plan Principal Problem:   Angioedema: Reported history of angioedema in the past, history of ACE inhibitor use, questionable confounding factor with consumption of fish.   Patient will be given Benadryl IV when necessary, couple more dosages of Solu-Medrol IV, when necessary Zofran, Pepcid IV 2 more dosages, gentle IV fluid hydration. Clear liquid diet after bedside swallow evaluation in the emergency department. Epinephrine if the patient displays signs symptoms of bronchospasm. C1 inhibitor concentrate if patient's symptoms do not improve. Recommend outpatient allergy of and immunology follow-up since this kind of episode has happened in the past.  Hypertension: Blood pressures currently normotensive. Discontinue use of ACE inhibitor/diuretic. Monitor blood pressures. Recommend permanent discontinuation of ACE inhibitor use. Will need other antihypertensive upon discharge or chronic outpatient use.  Generalized anxiety disorder: Ativan IV when necessary, if passes bedside swallow eval will consider resuming Xanax by mouth and Paxil by mouth.  History of obstructive sleep apnea noncompliant with CPAP  History of diabetes mellitus:  low-dose insulin sliding scale as the patient is only on clear liquids. Monitor blood sugars before meals and at bedtime.  Consultants: None Code Status: Full code DVT Prophylaxis: Lovenox subcutaneous Family Communication: Discussed with patient and wife present at the bedside Disposition Plan:  Likely discharge home in 24 hours Time spent: 17 min   [facility] Pager [phone]

## 2015-10-16 NOTE — ED Notes (Addendum)
Pt. woke up this morning with tongue and throat swelling , airway intact / respirations unlabored , denies fever or chills. O2 sat= 99% room air.

## 2015-10-16 NOTE — ED Provider Notes (Signed)
TIME SEEN: 6:00 AM  CHIEF COMPLAINT: Right lip and right tongue swelling  HPI: Patient is a 54 y.o. M with history of diabetes, hypertension who is on lisinopril, anxiety who presents to the emergency department with right lip and tongue swelling. States he woke up at 4 AM with the symptoms. States that he does not feel there has been any significant change in the past 2 hours. No rash, hives. No difficulty swallowing, speaking or breathing. States he feels as if a dentist has injected Novocain in his mouth. No facial numbness area no focal weakness. Has had similar symptoms once before a year ago. He states that he continued lisinopril. He has been on this for several years. No other new exposures including new medications, soaps, lotions, detergents or foods.  ROS: See HPI Constitutional: no fever  Eyes: no drainage  ENT: no runny nose   Cardiovascular:  no chest pain  Resp: no SOB  GI: no vomiting GU: no dysuria Integumentary: no rash  Allergy: no hives  Musculoskeletal: no leg swelling  Neurological: no slurred speech ROS otherwise negative  PAST MEDICAL HISTORY/PAST SURGICAL HISTORY:  Past Medical History  Diagnosis Date  . Anxiety   . Diabetes mellitus   . Hypertension   . Sleep apnea     has cpap but does not use    MEDICATIONS:  Prior to Admission medications   Medication Sig Start Date End Date Taking? Authorizing Provider  ACCU-CHEK AVIVA PLUS test strip  12/17/11   Historical Provider, MD  ALPRAZolam Duanne Moron) 1 MG tablet Take 1 mg by mouth 3 (three) times daily as needed.  02/03/12   Historical Provider, MD  ciprofloxacin (CIPRO) 500 MG tablet Take 1 tablet (500 mg total) by mouth 2 (two) times daily. 03/14/13   Robyn Haber, MD  colchicine 0.6 MG tablet Take 1 tablet (0.6 mg total) by mouth daily as needed (pain). 05/05/12 05/05/13  Gregor Hams, MD  HYDROcodone-acetaminophen (NORCO) 5-325 MG per tablet Take 1 tablet by mouth every 6 (six) hours as needed for pain.  03/14/13   Robyn Haber, MD  lisinopril-hydrochlorothiazide (PRINZIDE,ZESTORETIC) 20-25 MG per tablet Take 1 tablet by mouth daily.  11/21/11   Historical Provider, MD  metFORMIN (GLUCOPHAGE) 500 MG tablet Take 500 mg by mouth 2 (two) times daily with a meal.  12/07/11   Historical Provider, MD  PARoxetine (PAXIL) 20 MG tablet Take 20 mg by mouth every morning.  01/29/12   Historical Provider, MD    ALLERGIES:  No Known Allergies  SOCIAL HISTORY:  Social History  Substance Use Topics  . Smoking status: Current Every Day Smoker -- 0.80 packs/day    Types: Cigarettes  . Smokeless tobacco: Never Used  . Alcohol Use: 1.2 oz/week    2 Cans of beer per week    FAMILY HISTORY: Family History  Problem Relation Age of Onset  . Colon cancer Neg Hx   . Stomach cancer Neg Hx   . Hypertension Sister   . Hypertension Brother   . Diabetes Maternal Grandmother     EXAM: BP 143/84 mmHg  Pulse 85  Temp(Src) 97.8 F (36.6 C) (Oral)  Resp 26  Ht 6\' 2"  (1.88 m)  Wt 233 lb 2 oz (105.745 kg)  BMI 29.92 kg/m2  SpO2 100% CONSTITUTIONAL: Alert and oriented and responds appropriately to questions. Well-appearing; well-nourished HEAD: Normocephalic EYES: Conjunctivae clear, PERRL ENT: normal nose; no rhinorrhea; moist mucous membranes; pharynx without lesions noted, mild swelling of the right upper and  lower lip as well as the right tongue but normal phonation, no stridor, no trismus or drooling, no sign of dental abscess, no Ludwig's angina, normal phonation, no stridor NECK: Supple, no meningismus, no LAD  CARD: RRR; S1 and S2 appreciated; no murmurs, no clicks, no rubs, no gallops RESP: Normal chest excursion without splinting or tachypnea; breath sounds clear and equal bilaterally; no wheezes, no rhonchi, no rales, no hypoxia or respiratory distress, speaking full sentences ABD/GI: Normal bowel sounds; non-distended; soft, non-tender, no rebound, no guarding, no peritoneal signs BACK:  The  back appears normal and is non-tender to palpation, there is no CVA tenderness EXT: Normal ROM in all joints; non-tender to palpation; no edema; normal capillary refill; no cyanosis, no calf tenderness or swelling    SKIN: Normal color for age and race; warm, no hives or rash NEURO: Moves all extremities equally, sensation to light touch intact diffusely, cranial nerves II through XII intact PSYCH: The patient's mood and manner are appropriate. Grooming and personal hygiene are appropriate.  MEDICAL DECISION MAKING: Patient here with angioedema likely ACE inhibitor induced. Have advised him to stop taking lisinopril. He reports no significant change in the past 2 hours. Will give Solu-Medrol, Pepcid and Benadryl and continue to monitor patient.  ED PROGRESS: 7:00 AM  Pt feels as if the swelling is improving. I do not note any significant change on his exam. Will continue to monitor patient.   8:00 AM  Pt feels as if the tongue swelling is slightly worse. It does appear slightly more than previous and he does have a slightly muffled voice. Again hemodynamically stable. Will give epinephrine given slightly worsening symptoms and continue to monitor patient. Signed out to Dr. Oleta Mouse.  If symptoms continue to worsen he may need admission.     [place], DO 10/16/15 [phone]

## 2015-10-16 NOTE — ED Provider Notes (Signed)
54 year old male who presents with Ace inhibitor induced angioedema. Has been on long-standing lisinopril, with possible episode of angioedema of the lips about a year ago which she did not seek medical attention for. Woke up with 4 AM with angioedema involving the right side of his upper and lower lip as well as the right side of his tongue. Previous provider had given him Solu-Medrol, famotidine, and Benadryl with slight worsening of his tongue swelling. I had given him a dose of epinephrine, and signed out pending reevaluation. About an hour after receiving epinephrine, I reevaluated this patient, and he has improvement in the angioedema of his lips. However there is been no improvement of the angioedema of his tongue. His wife at bedside feels that his voice is slightly more muffled since receiving epinephrine. Patient continues to be protecting his airway, handling his secretions without difficulty. Vital signs are stable. Given lack of improvement of his tongue angioedema, is admitted for observation.  Forde Dandy, MD 10/16/15 [phone]

## 2015-10-16 NOTE — Evaluation (Signed)
Clinical/Bedside Swallow Evaluation Patient Details  Name: Scott Schaefer MRN: [id number] Date of Birth: 22-Feb-1961  Today's Date: 10/16/2015 Time: SLP Start Time (ACUTE ONLY): 53 SLP Stop Time (ACUTE ONLY): 1602 SLP Time Calculation (min) (ACUTE ONLY): 22 min  Past Medical History:  Past Medical History  Diagnosis Date  . Anxiety   . Diabetes mellitus   . Hypertension   . Sleep apnea     has cpap but does not use   Past Surgical History:  Past Surgical History  Procedure Laterality Date  . No prior surgeries     HPI:  54 y.o. male with a past medical history significant for hypertension, the patient is maintained on a combination of ACE inhibitor and a diuretic for the past few years, history DM.  Now with angioedema - questionable confounding factor with consumption of fish.  On clear liquids.     Assessment / Plan / Recommendation Clinical Impression  Pt presents with normal oropharyngeal swallow despite residual labial edema.  He reports improving sensation in tongue. No wheezing or difficulty breathing.  Pt able to masticate solids; had brisk swallow response; no s/s of aspiration with tested consistencies.  Recommend advancing diet to regular, thin liquids. No f/u recommended.               Diet Recommendation Age appropriate regular solids;Thin   Medication Administration: Whole meds with liquid    Other  Recommendations Oral Care Recommendations: Oral care BID         Swallow Study Prior Functional Status       General Date of Onset: 10/16/15 Other Pertinent Information: 54 y.o. male with a past medical history significant for hypertension, the patient is maintained on a combination of ACE inhibitor and a diuretic for the past few years, history DM.  Now with angioedema - questionable confounding factor with consumption of fish.  On clear liquids.   Type of Study: Bedside swallow evaluation Previous Swallow Assessment: no Diet Prior to this Study: Thin  liquids Temperature Spikes Noted: No Respiratory Status: Room air History of Recent Intubation: No Behavior/Cognition: Alert;Cooperative;Pleasant mood Oral Cavity - Dentition: Adequate natural dentition/normal for age Self-Feeding Abilities: Able to feed self Patient Positioning: Upright in bed Baseline Vocal Quality: Normal Volitional Cough: Strong Volitional Swallow: Able to elicit    Oral/Motor/Sensory Function Overall Oral Motor/Sensory Function: Other (comment) (edema upper lip; numbness of tongue much improved per pt)   Ice Chips Ice chips: Within functional limits Presentation: Spoon   Thin Liquid Thin Liquid: Within functional limits Presentation: Cup;Straw    Nectar Thick Nectar Thick Liquid: Not tested   Honey Thick Honey Thick Liquid: Not tested   Puree Puree: Within functional limits Presentation: Self Fed;Spoon   Solid   GO Functional Assessment Tool Used: clinical judgment Functional Limitations: Swallowing Swallow Current Status (R8309): 0 percent impaired, limited or restricted Swallow Goal Status (M0768): 0 percent impaired, limited or restricted Swallow Discharge Status [phone]): 0 percent impaired, limited or restricted  Solid: Within functional limits Presentation: Self Fed       Juan Quam Laurice 10/16/2015,4:12 PM

## 2015-10-17 DIAGNOSIS — F411 Generalized anxiety disorder: Secondary | ICD-10-CM | POA: Diagnosis not present

## 2015-10-17 DIAGNOSIS — I10 Essential (primary) hypertension: Secondary | ICD-10-CM | POA: Diagnosis not present

## 2015-10-17 DIAGNOSIS — E119 Type 2 diabetes mellitus without complications: Secondary | ICD-10-CM | POA: Diagnosis not present

## 2015-10-17 LAB — COMPREHENSIVE METABOLIC PANEL
ALT: 20 U/L (ref 17–63)
AST: 38 U/L (ref 15–41)
Albumin: 3.9 g/dL (ref 3.5–5.0)
Alkaline Phosphatase: 78 U/L (ref 38–126)
Anion gap: 12 (ref 5–15)
BILIRUBIN TOTAL: 0.2 mg/dL — AB (ref 0.3–1.2)
BUN: 15 mg/dL (ref 6–20)
CHLORIDE: 100 mmol/L — AB (ref 101–111)
CO2: 24 mmol/L (ref 22–32)
CREATININE: 1.04 mg/dL (ref 0.61–1.24)
Calcium: 9.1 mg/dL (ref 8.9–10.3)
Glucose, Bld: 227 mg/dL — ABNORMAL HIGH (ref 65–99)
POTASSIUM: 4.5 mmol/L (ref 3.5–5.1)
Sodium: 136 mmol/L (ref 135–145)
TOTAL PROTEIN: 6.7 g/dL (ref 6.5–8.1)

## 2015-10-17 LAB — GLUCOSE, CAPILLARY: Glucose-Capillary: 154 mg/dL — ABNORMAL HIGH (ref 65–99)

## 2015-10-17 LAB — CBC
HCT: 38 % — ABNORMAL LOW (ref 39.0–52.0)
Hemoglobin: 13.3 g/dL (ref 13.0–17.0)
MCH: 28.4 pg (ref 26.0–34.0)
MCHC: 35 g/dL (ref 30.0–36.0)
MCV: 81 fL (ref 78.0–100.0)
PLATELETS: 234 10*3/uL (ref 150–400)
RBC: 4.69 MIL/uL (ref 4.22–5.81)
RDW: 14.6 % (ref 11.5–15.5)
WBC: 11.4 10*3/uL — AB (ref 4.0–10.5)

## 2015-10-17 LAB — HEMOGLOBIN A1C
HEMOGLOBIN A1C: 7 % — AB (ref 4.8–5.6)
MEAN PLASMA GLUCOSE: 154 mg/dL

## 2015-10-17 MED ORDER — FAMOTIDINE 20 MG PO TABS: 20.0000 mg | ORAL_TABLET | Freq: Two times a day (BID) | ORAL | Status: DC

## 2015-10-17 MED ORDER — DIPHENHYDRAMINE HCL 25 MG PO TABS: 25.0000 mg | ORAL_TABLET | Freq: Four times a day (QID) | ORAL | Status: DC | PRN

## 2015-10-17 MED ORDER — PREDNISONE 10 MG (21) PO TBPK: ORAL_TABLET | ORAL | Status: DC

## 2015-10-17 MED ORDER — HYDROCHLOROTHIAZIDE 25 MG PO TABS: 25.0000 mg | ORAL_TABLET | Freq: Every day | ORAL | Status: AC

## 2015-10-17 NOTE — Progress Notes (Signed)
Pt provided with discharge instruction including information on follow up appointments and new medications. Pt verbalized understanding of all information. IV was discharged with no complications. VSS. Pt refused wheelchair and escorted out by wife.   Tyna Jaksch, RN

## 2015-10-17 NOTE — Progress Notes (Signed)
Patient's belt found in room, after discharge.  Patient notified.  Jillyn Ledger, MBA, BS, RN

## 2015-10-17 NOTE — Discharge Summary (Signed)
Physician Discharge Summary   Patient ID: Scott Schaefer MRN: [id number] DOB/AGE: 07-06-1961 54 y.o.  Admit date: 10/16/2015 Discharge date: 10/17/2015  Primary Care Physician:  No primary care provider on file.  Discharge Diagnoses:    . Angioedema -improved   Hypertension    Consults:  None   Recommendations for Outpatient Follow-up:  Patient was recommended to discontinue lisinopril from his combination medication of lisinopril HCTZ  He was recommended to follow-up the [facility]    DIET:   Heart healthy diet   Allergies:   Allergies  Allergen Reactions  . Ace Inhibitors Other (See Comments)    Angioedema - history of angioedema in the past, history of ACE inhibitor use, questionable confounding factor with consumption of fish     Discharge Medications:   Medication List    STOP taking these medications        lisinopril-hydrochlorothiazide 20-25 MG tablet  Commonly known as:  PRINZIDE,ZESTORETIC      TAKE these medications        ALPRAZolam 1 MG tablet  Commonly known as:  XANAX  Take 1 mg by mouth 3 (three) times daily as needed for anxiety.     diphenhydrAMINE 25 MG tablet  Commonly known as:  BENADRYL  Take 1 tablet (25 mg total) by mouth every 6 (six) hours as needed for itching or allergies (also over the counter).     famotidine 20 MG tablet  Commonly known as:  PEPCID  Take 1 tablet (20 mg total) by mouth 2 (two) times daily. While on steroid pack     hydrochlorothiazide 25 MG tablet  Commonly known as:  HYDRODIURIL  Take 1 tablet (25 mg total) by mouth daily.     metFORMIN 500 MG tablet  Commonly known as:  GLUCOPHAGE  Take 500 mg by mouth 2 (two) times daily with a meal.     PARoxetine 20 MG tablet  Commonly known as:  PAXIL  Take 20 mg by mouth every morning.     predniSONE 10 MG (21) Tbpk tablet  Commonly known as:  STERAPRED UNI-PAK 21 TAB  Take as directed on the pack         Brief H and P: For complete  details please refer to admission H and P, but in brief  Per Dr. Inda Castle admit note on 10/16/15  Scott Schaefer is a 54 y.o. male with a past medical history significant for hypertension, the patient is maintained on a combination of ACE inhibitor and a diuretic for the past few years, history of diabetes mellitus 4 which the patient is on metformin. The patient states he has recently been diagnosed with generalized anxiety disorder and has been started on Xanax as well as Paxil. The patient has been diagnosed with obstructive sleep apnea but states that he is not able to be compliant with use of his CPAP machine. The patient states he was out at a retreat yesterday. He had some fish fried in canola oil. Patient's wife states that about a year ago the patient developed swelling of his lips that subsided without any medical intervention in 24 hours. The patient woke his wife up at around 4 AM on 10-16-15 because his face was numb and his lips were swollen. Patient did not have a rash patient did not have any wheezing patient did not have any chest tightness, patient did not have any abdominal pain, patient did not have any shortness of breath, patient denies any fevers.  The patient's  wife became concerned and brought him into the emergency department. In the emergency department, the patient was given Solu-Medrol, Pepcid was also given epinephrine. Patient is awake alert able to talk able to manage his secretions however continues to have swollen lips and swollen tongue.  Hospital Course:   Angioedema: Reported history of angioedema in the past, history of ACE inhibitor use, questionable confounding factor with consumption of fish. Resolved - Patient was given Benadryl, Solu-Medrol, Pepcid with gentle hydration in ED. - He was started on clear liquid diet, subsequently the angioedema resolved and he has been tolerating solid diet without any difficulty. - As the patient's symptoms quickly improved, C1  inhibitor was not ordered. He was placed on oral Pepcid with Prevpac and Benadryl as needed for discharge. - Patient was recommended to follow up outpatient with allergy/immunology in next 10 days to 2 weeks for further workup. - Lisinopril/ HCTZ was discontinued. Patient was recommended not to take ACE inhibitor again.   Hypertension: Blood pressures currently normotensive.  - Discontinued use of ACE inhibitor. - Patient was given separate prescription for HCTZ.   Generalized anxiety disorder: - Currently stable, continue paroxetine  History of obstructive sleep apnea noncompliant with CPAP  History of diabetes mellitus: - Continue metformin   Day of Discharge BP 123/71 mmHg  Pulse 61  Temp(Src) 97.9 F (36.6 C) (Oral)  Resp 18  Ht 6\' 2"  (1.88 m)  Wt 105.3 kg (232 lb 2.3 oz)  BMI 29.79 kg/m2  SpO2 98%  Physical Exam: General: Alert and awake oriented x3 not in any acute distress. lips swelling, tongue swelling has resolved  HEENT: anicteric sclera, pupils reactive to light and accommodation CVS: S1-S2 clear no murmur rubs or gallops Chest: clear to auscultation bilaterally, no wheezing rales or rhonchi Abdomen: soft nontender, nondistended, normal bowel sounds Extremities: no cyanosis, clubbing or edema noted bilaterally Neuro: Cranial nerves II-XII intact, no focal neurological deficits   The results of significant diagnostics from this hospitalization (including imaging, microbiology, ancillary and laboratory) are listed below for reference.    LAB RESULTS: Basic Metabolic Panel:  Recent Labs Lab 10/16/15 0907 10/16/15 1314 10/17/15 0511  NA 135  --  136  K 4.2  --  4.5  CL 96*  --  100*  CO2  --   --  24  GLUCOSE 166*  --  227*  BUN 14  --  15  CREATININE 1.10 1.17 1.04  CALCIUM  --   --  9.1   Liver Function Tests:  Recent Labs Lab 10/17/15 0511  AST 38  ALT 20  ALKPHOS 78  BILITOT 0.2*  PROT 6.7  ALBUMIN 3.9   No results for  input(s): LIPASE, AMYLASE in the last 168 hours. No results for input(s): AMMONIA in the last 168 hours. CBC:  Recent Labs Lab 10/16/15 1314 10/17/15 0511  WBC 6.9 11.4*  HGB 14.3 13.3  HCT 40.9 38.0*  MCV 81.3 81.0  PLT 245 234   Cardiac Enzymes: No results for input(s): CKTOTAL, CKMB, CKMBINDEX, TROPONINI in the last 168 hours. BNP: Invalid input(s): POCBNP CBG:  Recent Labs Lab 10/16/15 2045 10/17/15 0747  GLUCAP 137* 154*    Significant Diagnostic Studies:  No results found.  2D ECHO:   Disposition and Follow-up:     Discharge Instructions    Diet Carb Modified    Complete by:  As directed      Discharge instructions    Complete by:  As directed   Please STOP lisinopril.  You were taking combination of lisnopril/HCTZ, which is now discontinued. You have new prescription for hydrochlorthiazide.     Increase activity slowly    Complete by:  As directed             DISPOSITION: Home   DISCHARGE FOLLOW-UP Follow-up Information    Follow up with [facility], [place]. Schedule an appointment as soon as possible for a visit in 2 weeks.   Specialty:  Allergy   Why:  [facility]    Contact information:   [address] [place] [address] [phone]        Time spent on Discharge: 25 minutes   Signed:   RAI,RIPUDEEP M.D. [facility] 10/17/2015, 11:03 AM Pager: [phone]

## 2016-02-27 ENCOUNTER — Ambulatory Visit (INDEPENDENT_AMBULATORY_CARE_PROVIDER_SITE_OTHER): Payer: BLUE CROSS/BLUE SHIELD | Admitting: Family Medicine

## 2016-02-27 VITALS — BP 148/90 | HR 76 | Temp 98.7°F | Resp 16 | Ht 74.0 in | Wt 246.6 lb

## 2016-02-27 DIAGNOSIS — R300 Dysuria: Secondary | ICD-10-CM | POA: Diagnosis not present

## 2016-02-27 LAB — POCT CBC
Granulocyte percent: 40.8 %G (ref 37–80)
HCT, POC: 41.2 % — AB (ref 43.5–53.7)
Hemoglobin: 14.3 g/dL (ref 14.1–18.1)
Lymph, poc: 3.8 — AB (ref 0.6–3.4)
MCH: 29.7 pg (ref 27–31.2)
MCHC: 34.6 g/dL (ref 31.8–35.4)
MCV: 85.7 fL (ref 80–97)
MID (CBC): 0.7 (ref 0–0.9)
MPV: 7.2 fL (ref 0–99.8)
POC Granulocyte: 3.1 (ref 2–6.9)
POC LYMPH PERCENT: 49.9 %L (ref 10–50)
POC MID %: 9.3 % (ref 0–12)
Platelet Count, POC: 221 10*3/uL (ref 142–424)
RBC: 4.81 M/uL (ref 4.69–6.13)
RDW, POC: 15.6 %
WBC: 7.6 10*3/uL (ref 4.6–10.2)

## 2016-02-27 LAB — POC MICROSCOPIC URINALYSIS (UMFC): Mucus: ABSENT

## 2016-02-27 LAB — POCT URINALYSIS DIP (MANUAL ENTRY)
BILIRUBIN UA: NEGATIVE
BILIRUBIN UA: NEGATIVE
Glucose, UA: NEGATIVE
Leukocytes, UA: NEGATIVE
Nitrite, UA: NEGATIVE
PH UA: 8.5
Protein Ur, POC: NEGATIVE
RBC UA: NEGATIVE
Spec Grav, UA: 1.01
Urobilinogen, UA: 0.2

## 2016-02-27 LAB — GLUCOSE, POCT (MANUAL RESULT ENTRY): POC GLUCOSE: 120 mg/dL — AB (ref 70–99)

## 2016-02-27 NOTE — Progress Notes (Signed)
Subjective:   By signing my name below, I, Raven Small, attest that this documentation has been prepared under the direction and in the presence of Delman Cheadle, MD.  Electronically Signed: Thea Alken, ED Scribe. 02/27/2016. 6:41 PM.   Patient ID: Scott Schaefer, male    DOB: 04-11-1961, 55 y.o.   MRN: [id number]  HPI Chief Complaint  Patient presents with  . Std testing    Penile burning not when urinating x 3 days    HPI Comments: Scott Schaefer is a 55 y.o. male who presents to the [facility] complaining of dysuria that began 3 days ago. Pt has dysuria at the urethra after urinating. He also has had some burning with relaxing, such as watching TV. Pt has been in a monogamous for 6 years. He states e has anxiety and does not know if he is over thinking symptoms. He denies penile discharge, nausea, emesis, abdominal pain, testicular pain, rash, sores and trouble with BM. he denies hx of fever blisters and cold sores. He has hx of DM and normally has urinary frequency and nocturia. He wakes up during the night to urinate 2-3 times. He is currently on metformin.   Patient Active Problem List   Diagnosis Date Noted  . Angioedema 10/16/2015  . Anxiety   . Type 2 diabetes mellitus without complication, without long-term current use of insulin ([place])   . Essential hypertension   . HYPERLIPIDEMIA 05/20/2009  . OBSTRUCTIVE SLEEP APNEA 05/20/2009  . HYPERTENSION 05/20/2009   Past Medical History  Diagnosis Date  . Anxiety   . Diabetes mellitus   . Hypertension   . Sleep apnea     has cpap but does not use   Past Surgical History  Procedure Laterality Date  . No prior surgeries     Allergies  Allergen Reactions  . Ace Inhibitors Other (See Comments)    Angioedema - history of angioedema in the past, history of ACE inhibitor use, questionable confounding factor with consumption of fish   Prior to Admission medications   Medication Sig Start Date End Date Taking?  Authorizing Provider  ALPRAZolam Duanne Moron) 1 MG tablet Take 1 mg by mouth 3 (three) times daily as needed for anxiety.  02/03/12  Yes Historical Provider, MD  diphenhydrAMINE (BENADRYL) 25 MG tablet Take 1 tablet (25 mg total) by mouth every 6 (six) hours as needed for itching or allergies (also over the counter). 10/17/15  Yes Ripudeep Krystal Eaton, MD  hydrochlorothiazide (HYDRODIURIL) 25 MG tablet Take 1 tablet (25 mg total) by mouth daily. 10/17/15  Yes Ripudeep Krystal Eaton, MD  metFORMIN (GLUCOPHAGE) 500 MG tablet Take 500 mg by mouth 2 (two) times daily with a meal.  12/07/11  Yes Historical Provider, MD  PARoxetine (PAXIL) 20 MG tablet Take 20 mg by mouth every morning.  01/29/12  Yes Historical Provider, MD  famotidine (PEPCID) 20 MG tablet Take 1 tablet (20 mg total) by mouth 2 (two) times daily. While on steroid pack Patient not taking: Reported on 02/27/2016 10/17/15   Ripudeep Krystal Eaton, MD   Social History   Social History  . Marital Status: Single    Spouse Name: N/A  . Number of Children: N/A  . Years of Education: N/A   Occupational History  . Not on file.   Social History Main Topics  . Smoking status: Current Every Day Smoker -- 0.80 packs/day    Types: Cigarettes  . Smokeless tobacco: Never Used  . Alcohol Use:  1.2 oz/week    2 Cans of beer per week  . Drug Use: No  . Sexual Activity: Yes   Other Topics Concern  . Not on file   Social History Narrative   Review of Systems  Constitutional: Negative for fever and chills.  Gastrointestinal: Negative for nausea, vomiting, abdominal pain, diarrhea, constipation, blood in stool, abdominal distention and rectal pain.  Endocrine: Positive for polyuria.  Genitourinary: Positive for dysuria and frequency. Negative for urgency, hematuria, decreased urine volume, penile swelling, difficulty urinating and testicular pain.  Skin: Negative for color change and rash.   Objective:   Physical Exam  Constitutional: He is oriented to person,  place, and time. He appears well-developed and well-nourished. No distress.  HENT:  Head: Normocephalic and atraumatic.  Eyes: Conjunctivae and EOM are normal.  Neck: Neck supple.  Cardiovascular: Normal rate.   Pulmonary/Chest: Effort normal.  Abdominal: Hernia confirmed negative in the right inguinal area and confirmed negative in the left inguinal area.  Genitourinary: Testes normal and penis normal. Right testis shows no mass, no swelling and no tenderness. Left testis shows no mass, no swelling and no tenderness. Circumcised. No penile erythema or penile tenderness. No discharge found.  Musculoskeletal: Normal range of motion.  Lymphadenopathy:       Right: No inguinal adenopathy present.       Left: No inguinal adenopathy present.  Neurological: He is alert and oriented to person, place, and time.  Skin: Skin is warm and dry.  Psychiatric: He has a normal mood and affect. His behavior is normal.  Nursing note and vitals reviewed.  Filed Vitals:   02/27/16 1749  BP: 148/90  Pulse: 76  Temp: 98.7 F (37.1 C)  TempSrc: Oral  Resp: 16  Height: 6\' 2"  (1.88 m)  Weight: 246 lb 9.6 oz (111.857 kg)  SpO2: 98%   Results for orders placed or performed in visit on 02/27/16  POCT urinalysis dipstick  Result Value Ref Range   Color, UA yellow yellow   Clarity, UA clear clear   Glucose, UA negative negative   Bilirubin, UA negative negative   Ketones, POC UA negative negative   Spec Grav, UA 1.010    Blood, UA negative negative   pH, UA 8.5    Protein Ur, POC negative negative   Urobilinogen, UA 0.2    Nitrite, UA Negative Negative   Leukocytes, UA Negative Negative  POCT Microscopic Urinalysis ([facility])  Result Value Ref Range   WBC,UR,HPF,POC None None WBC/hpf   RBC,UR,HPF,POC None None RBC/hpf   Bacteria None None, Too numerous to count   Mucus Absent Absent   Epithelial Cells, UR Per Microscopy None None, Too numerous to count cells/hpf  POCT glucose (manual entry)    Result Value Ref Range   POC Glucose 120 (A) 70 - 99 mg/dl  POCT CBC  Result Value Ref Range   WBC 7.6 4.6 - 10.2 K/uL   Lymph, poc 3.8 (A) 0.6 - 3.4   POC LYMPH PERCENT 49.9 10 - 50 %L   MID (cbc) 0.7 0 - 0.9   POC MID % 9.3 0 - 12 %M   POC Granulocyte 3.1 2 - 6.9   Granulocyte percent 40.8 37 - 80 %G   RBC 4.81 4.69 - 6.13 M/uL   Hemoglobin 14.3 14.1 - 18.1 g/dL   HCT, POC 41.2 (A) 43.5 - 53.7 %   MCV 85.7 80 - 97 fL   MCH, POC 29.7 27 - 31.2 pg  MCHC 34.6 31.8 - 35.4 g/dL   RDW, POC 15.6 %   Platelet Count, POC 221 142 - 424 K/uL   MPV 7.2 0 - 99.8 fL   Assessment & Plan:   1. Dysuria   Exam nml, labs nml - advised therapeutic trial of pyridium or could treat with rocephin 250mg  x 1 but as risk of UTI/prostatitis/STI so unlikely pt ok with just trying to push fluids and watchful waiting while uclx and sti labs P.  Orders Placed This Encounter  Procedures  . GC/Chlamydia Probe Amp  . Urine culture  . Trichomonas vaginalis, RNA  . RPR  . HIV antibody  . Hepatitis C Antibody  . POCT urinalysis dipstick  . POCT Microscopic Urinalysis ([facility])  . POCT glucose (manual entry)  . POCT CBC    I personally performed the services described in this documentation, which was scribed in my presence. The recorded information has been reviewed and considered, and addended by me as needed.  Delman Cheadle, MD MPH

## 2016-02-27 NOTE — Patient Instructions (Addendum)
IF you received an x-ray today, you will receive an invoice from [facility]. Please contact [facility] at [phone] with questions or concerns regarding your invoice.   IF you received labwork today, you will receive an invoice from [employer]. Please contact [facility] at [phone] with questions or concerns regarding your invoice.   Our billing staff will not be able to assist you with questions regarding bills from these companies.  You will be contacted with the lab results as soon as they are available. The fastest way to get your results is to activate your My Chart account. Instructions are located on the last page of this paperwork. If you have not heard from [place] regarding the results in 2 weeks, please contact this office.  Dysuria Dysuria is pain or discomfort while urinating. The pain or discomfort may be felt in the tube that carries urine out of the bladder (urethra) or in the surrounding tissue of the genitals. The pain may also be felt in the groin area, lower abdomen, and lower back. You may have to urinate frequently or have the sudden feeling that you have to urinate (urgency). Dysuria can affect both men and women, but is more common in women. Dysuria can be caused by many different things, including:  Urinary tract infection in women.  Infection of the kidney or bladder.  Kidney stones or bladder stones.  Certain sexually transmitted infections (STIs), such as chlamydia.  Dehydration.  Inflammation of the vagina.  Use of certain medicines.  Use of certain soaps or scented products that cause irritation. HOME CARE INSTRUCTIONS Watch your dysuria for any changes. The following actions may help to reduce any discomfort you are feeling:  Drink enough fluid to keep your urine clear or pale yellow.  Empty your bladder often. Avoid holding urine for long periods of time.  After a bowel movement or urination, women should  cleanse from front to back, using each tissue only once.  Empty your bladder after sexual intercourse.  Take medicines only as directed by your health care provider.  If you were prescribed an antibiotic medicine, finish it all even if you start to feel better.  Avoid caffeine, tea, and alcohol. They can irritate the bladder and make dysuria worse. In men, alcohol may irritate the prostate.  Keep all follow-up visits as directed by your health care provider. This is important.  If you had any tests done to find the cause of dysuria, it is your responsibility to obtain your test results. Ask the lab or department performing the test when and how you will get your results. Talk with your health care provider if you have any questions about your results. SEEK MEDICAL CARE IF:  You develop pain in your back or sides.  You have a fever.  You have nausea or vomiting.  You have blood in your urine.  You are not urinating as often as you usually do. SEEK IMMEDIATE MEDICAL CARE IF:  You pain is severe and not relieved with medicines.  You are unable to hold down any fluids.  You or someone else notices a change in your mental function.  You have a rapid heartbeat at rest.  You have shaking or chills.  You feel extremely weak.   This information is not intended to replace advice given to you by your health care provider. Make sure you discuss any questions you have with your health care provider.   Document Released: 08/31/2004 Document Revised: 12/24/2014 Document Reviewed:  07/29/2014 Elsevier Interactive Patient Education [employer]. Sexually Transmitted Disease A sexually transmitted disease (STD) is a disease or infection that may be passed (transmitted) from person to person, usually during sexual activity. This may happen by way of saliva, semen, blood, vaginal mucus, or urine. Common STDs include:  Gonorrhea.  Chlamydia.  Syphilis.  HIV and AIDS.  Genital  herpes.  Hepatitis B and C.  Trichomonas.  Human papillomavirus (HPV).  Pubic lice.  Scabies.  Mites.  Bacterial vaginosis. WHAT ARE CAUSES OF STDs? An STD may be caused by bacteria, a virus, or parasites. STDs are often transmitted during sexual activity if one person is infected. However, they may also be transmitted through nonsexual means. STDs may be transmitted after:   Sexual intercourse with an infected person.  Sharing sex toys with an infected person.  Sharing needles with an infected person or using unclean piercing or tattoo needles.  Having intimate contact with the genitals, mouth, or rectal areas of an infected person.  Exposure to infected fluids during birth. WHAT ARE THE SIGNS AND SYMPTOMS OF STDs? Different STDs have different symptoms. Some people may not have any symptoms. If symptoms are present, they may include:  Painful or bloody urination.  Pain in the pelvis, abdomen, vagina, anus, throat, or eyes.  A skin rash, itching, or irritation.  Growths, ulcerations, blisters, or sores in the genital and anal areas.  Abnormal vaginal discharge with or without bad odor.  Penile discharge in men.  Fever.  Pain or bleeding during sexual intercourse.  Swollen glands in the groin area.  Yellow skin and eyes (jaundice). This is seen with hepatitis.  Swollen testicles.  Infertility.  Sores and blisters in the mouth. HOW ARE STDs DIAGNOSED? To make a diagnosis, your health care provider may:  Take a medical history.  Perform a physical exam.  Take a sample of any discharge to examine.  Swab the throat, cervix, opening to the penis, rectum, or vagina for testing.  Test a sample of your first morning urine.  Perform blood tests.  Perform a Pap test, if this applies.  Perform a colposcopy.  Perform a laparoscopy. HOW ARE STDs TREATED? Treatment depends on the STD. Some STDs may be treated but not cured.  Chlamydia, gonorrhea,  trichomonas, and syphilis can be cured with antibiotic medicine.  Genital herpes, hepatitis, and HIV can be treated, but not cured, with prescribed medicines. The medicines lessen symptoms.  Genital warts from HPV can be treated with medicine or by freezing, burning (electrocautery), or surgery. Warts may come back.  HPV cannot be cured with medicine or surgery. However, abnormal areas may be removed from the cervix, vagina, or vulva.  If your diagnosis is confirmed, your recent sexual partners need treatment. This is true even if they are symptom-free or have a negative culture or evaluation. They should not have sex until their health care providers say it is okay.  Your health care provider may test you for infection again 3 months after treatment. HOW CAN I REDUCE MY RISK OF GETTING AN STD? Take these steps to reduce your risk of getting an STD:  Use latex condoms, dental dams, and water-soluble lubricants during sexual activity. Do not use petroleum jelly or oils.  Avoid having multiple sex partners.  Do not have sex with someone who has other sex partners  Do not have sex with anyone you do not know or who is at high risk for an STD.  Avoid risky sex practices that  can break your skin.  Do not have sex if you have open sores on your mouth or skin.  Avoid drinking too much alcohol or taking illegal drugs. Alcohol and drugs can affect your judgment and put you in a vulnerable position.  Avoid engaging in oral and anal sex acts.  Get vaccinated for HPV and hepatitis. If you have not received these vaccines in the past, talk to your health care provider about whether one or both might be right for you.  If you are at risk of being infected with HIV, it is recommended that you take a prescription medicine daily to prevent HIV infection. This is called pre-exposure prophylaxis (PrEP). You are considered at risk if:  You are a man who has sex with other men (MSM).  You are a  heterosexual man or woman and are sexually active with more than one partner.  You take drugs by injection.  You are sexually active with a partner who has HIV.  Talk with your health care provider about whether you are at high risk of being infected with HIV. If you choose to begin PrEP, you should first be tested for HIV. You should then be tested every 3 months for as long as you are taking PrEP. WHAT SHOULD I DO IF I THINK I HAVE AN STD?  See your health care provider.  Tell your sexual partner(s). They should be tested and treated for any STDs.  Do not have sex until your health care provider says it is okay. WHEN SHOULD I GET IMMEDIATE MEDICAL CARE? Contact your health care provider right away if:   You have severe abdominal pain.  You are a man and notice swelling or pain in your testicles.  You are a woman and notice swelling or pain in your vagina.   This information is not intended to replace advice given to you by your health care provider. Make sure you discuss any questions you have with your health care provider.   Document Released: 02/23/2003 Document Revised: 12/24/2014 Document Reviewed: 06/23/2013 Elsevier Interactive Patient Education [employer].

## 2016-02-28 LAB — HIV ANTIBODY (ROUTINE TESTING W REFLEX): HIV 1&2 Ab, 4th Generation: NONREACTIVE

## 2016-02-28 LAB — HEPATITIS C ANTIBODY: HCV Ab: NEGATIVE

## 2016-02-29 LAB — URINE CULTURE
COLONY COUNT: NO GROWTH
ORGANISM ID, BACTERIA: NO GROWTH

## 2016-02-29 LAB — GC/CHLAMYDIA PROBE AMP
CT Probe RNA: NOT DETECTED
GC PROBE AMP APTIMA: NOT DETECTED

## 2016-02-29 LAB — TRICHOMONAS VAGINALIS, PROBE AMP: T vaginalis RNA: NEGATIVE

## 2016-02-29 LAB — RPR

## 2016-04-24 ENCOUNTER — Ambulatory Visit (INDEPENDENT_AMBULATORY_CARE_PROVIDER_SITE_OTHER): Payer: BLUE CROSS/BLUE SHIELD | Admitting: Physician Assistant

## 2016-04-24 VITALS — BP 137/90 | HR 91 | Temp 98.6°F | Ht 73.5 in | Wt 240.0 lb

## 2016-04-24 DIAGNOSIS — L0501 Pilonidal cyst with abscess: Secondary | ICD-10-CM

## 2016-04-24 MED ORDER — DOXYCYCLINE HYCLATE 100 MG PO CAPS: 100.0000 mg | ORAL_CAPSULE | Freq: Two times a day (BID) | ORAL | Status: AC

## 2016-04-24 MED ORDER — HYDROCODONE-ACETAMINOPHEN 5-325 MG PO TABS: 1.0000 | ORAL_TABLET | Freq: Four times a day (QID) | ORAL | Status: DC | PRN

## 2016-04-24 MED ORDER — DOXYCYCLINE HYCLATE 100 MG PO CAPS: 100.0000 mg | ORAL_CAPSULE | Freq: Two times a day (BID) | ORAL | Status: DC

## 2016-04-24 NOTE — Addendum Note (Signed)
Addended by: Ezekiel Slocumb on: 04/24/2016 02:50 PM   Modules accepted: Orders

## 2016-04-24 NOTE — Patient Instructions (Addendum)
Change dressing if dressing is soiled. Keep covered at all times except when showering. You may get wet in the shower but do not scrub.  Apply warm compresses/heating pad to facilitate drainage. Avoid putting pressure on this area -- lay on stomach or side Leave packing in place. Do not apply any ointments as this may delay healing. If an antibiotic was prescribed, take as directed until finished - twice a day for 10 days. Avoid excess sun exposure while on this medication Return in 48 hours for wound care.   IF you received an x-ray today, you will receive an invoice from [facility]. Please contact [facility] at [phone] with questions or concerns regarding your invoice.   IF you received labwork today, you will receive an invoice from [employer]. Please contact [facility] at [phone] with questions or concerns regarding your invoice.   Our billing staff will not be able to assist you with questions regarding bills from these companies.  You will be contacted with the lab results as soon as they are available. The fastest way to get your results is to activate your My Chart account. Instructions are located on the last page of this paperwork. If you have not heard from [place] regarding the results in 2 weeks, please contact this office.

## 2016-04-24 NOTE — Progress Notes (Signed)
[facility] [address] [place] [address] [phone]  Date:  04/24/2016   Name:  Scott Schaefer   DOB:  November 02, 1961   MRN:  [id number]  PCP:  No primary care provider on file.    Chief Complaint: Recurrent Skin Infections   History of Present Illness:  This is a 55 y.o. male with PMH anxiety, DM2, HTN, OSA, HLD, tobacco abuse who is presenting stating he has a rectal abscess. Been going on for 3 days now. States he is prone to these and the men in his family (dad, brother) get these as well. Has been trying hot showers but not helping. Denies fever, chills.   Review of Systems:  Review of Systems See HPI  Patient Active Problem List   Diagnosis Date Noted  . Angioedema 10/16/2015  . Anxiety   . Type 2 diabetes mellitus without complication, without long-term current use of insulin ([place])   . Essential hypertension   . HYPERLIPIDEMIA 05/20/2009  . OBSTRUCTIVE SLEEP APNEA 05/20/2009  . HYPERTENSION 05/20/2009    Prior to Admission medications   Medication Sig Start Date End Date Taking? Authorizing Provider  ALPRAZolam Duanne Moron) 1 MG tablet Take 1 mg by mouth 3 (three) times daily as needed for anxiety.  02/03/12  Yes Historical Provider, MD  diphenhydrAMINE (BENADRYL) 25 MG tablet Take 1 tablet (25 mg total) by mouth every 6 (six) hours as needed for itching or allergies (also over the counter). 10/17/15  Yes Ripudeep Krystal Eaton, MD  famotidine (PEPCID) 20 MG tablet Take 1 tablet (20 mg total) by mouth 2 (two) times daily. While on steroid pack 10/17/15  Yes Ripudeep K Rai, MD  hydrochlorothiazide (HYDRODIURIL) 25 MG tablet Take 1 tablet (25 mg total) by mouth daily. 10/17/15  Yes Ripudeep Krystal Eaton, MD  metFORMIN (GLUCOPHAGE) 500 MG tablet Take 500 mg by mouth 2 (two) times daily with a meal.  12/07/11  Yes Historical Provider, MD  PARoxetine (PAXIL) 20 MG tablet Take 20 mg by mouth every morning.  01/29/12  Yes Historical Provider, MD    Allergies  Allergen  Reactions  . Ace Inhibitors Other (See Comments)    Angioedema - history of angioedema in the past, history of ACE inhibitor use, questionable confounding factor with consumption of fish    Past Surgical History  Procedure Laterality Date  . No prior surgeries      Social History  Substance Use Topics  . Smoking status: Current Every Day Smoker -- 0.80 packs/day    Types: Cigarettes  . Smokeless tobacco: Never Used  . Alcohol Use: 1.2 oz/week    2 Cans of beer per week    Family History  Problem Relation Age of Onset  . Colon cancer Neg Hx   . Stomach cancer Neg Hx   . Hypertension Sister   . Hypertension Brother   . Diabetes Maternal Grandmother     Medication list has been reviewed and updated.  Physical Examination:  Physical Exam  Constitutional: He is oriented to person, place, and time. He appears well-developed and well-nourished. No distress.  HENT:  Head: Normocephalic and atraumatic.  Right Ear: Hearing normal.  Left Ear: Hearing normal.  Nose: Nose normal.  Eyes: Conjunctivae and lids are normal. Right eye exhibits no discharge. Left eye exhibits no discharge. No scleral icterus.  Pulmonary/Chest: Effort normal. No respiratory distress.  Musculoskeletal: Normal range of motion.  Neurological: He is alert and oriented to person, place, and time.  Skin:  Skin is warm, dry and intact.  Upper gluteal crest with abscess on left side. Erythema, tender, induration, central fluctuance. No drainage.  Psychiatric: He has a normal mood and affect. His speech is normal and behavior is normal. Thought content normal.   BP 137/90 mmHg  Pulse 91  Temp(Src) 98.6 F (37 C) (Oral)  Ht 6' 1.5" (1.867 m)  Wt 240 lb (108.863 kg)  BMI 31.23 kg/m2  SpO2 98%  Procedure: Verbal consent obtained. Skin was cleaned with alcohol and anesthetized with 3 cc 1% lido with epi. A 1 cm incision was made to left of gluteal crest. Expression revealed another opening midline which was  incised slightly. A large amount of purulent material expressed. Wound cavity size 1.5 cm depth. Wound aggressively packed with 1/4 inch packing. Wound dressed and wound care discussed.  Assessment and Plan:  1. Pilonidal abscess I&D performed, packed aggressively. Start on doxy, wound clx pending. Wound care discussed. Pt requested something for pain to help him sleep at night - #10 norco provided. Return in 48 hours for wound care. - Wound culture - doxycycline (VIBRAMYCIN) 100 MG capsule; Take 1 capsule (100 mg total) by mouth 2 (two) times daily. AVOID EXCESS SUN EXPOSURE WHILE ON THIS MEDICATION  Dispense: 20 capsule; Refill: 0 - HYDROcodone-acetaminophen (NORCO) 5-325 MG tablet; Take 1 tablet by mouth every 6 (six) hours as needed.  Dispense: 10 tablet; Refill: 0   Benjaman Pott. Drenda Freeze, MHS [facility]  04/24/2016

## 2016-04-26 ENCOUNTER — Ambulatory Visit (INDEPENDENT_AMBULATORY_CARE_PROVIDER_SITE_OTHER): Payer: BLUE CROSS/BLUE SHIELD | Admitting: Physician Assistant

## 2016-04-26 VITALS — BP 131/78 | HR 68 | Temp 98.3°F | Resp 18

## 2016-04-26 DIAGNOSIS — Z5189 Encounter for other specified aftercare: Secondary | ICD-10-CM

## 2016-04-26 LAB — WOUND CULTURE
GRAM STAIN: NONE SEEN
Gram Stain: NONE SEEN
Organism ID, Bacteria: NORMAL

## 2016-04-30 ENCOUNTER — Telehealth: Payer: Self-pay

## 2016-04-30 NOTE — Telephone Encounter (Signed)
Scott Schaefer,  Pt saw you on the 04/24/16. Came in on 04/26/16 BUT left without being seen. Patient now states that he is not able to come in today and will have to follow up on Saturday 05/05/16. Is there anything we can recommend for patient. Please advise   Thanks   [place]

## 2016-04-30 NOTE — Telephone Encounter (Signed)
Has he pulled out the packing?? He needs to make sure he has pulled out the packing if he is going to wait that long. He should continue dressing changes and honestly he does not need to come in on the 20th, at that time I would hope that the wound has healed. He should return if he is having problems.

## 2016-04-30 NOTE — Telephone Encounter (Signed)
PATIENT JUST WANTED A NOTE MADE STATING HE CANNOT MAKE IT IN TODAY (MONDAY) FOR HIS WOUND CARE RECHECK DUE TO WORK OBLIGATIONS. HE WILL RETURN ON Saturday 05/05/2016. PLEASE CALL HIM IF THAT WILL BE A PROBLEM. BEST PHONE [phone] (CELL)  [place]

## 2016-05-01 NOTE — Telephone Encounter (Signed)
Called and left message on VM.

## 2016-05-14 NOTE — Progress Notes (Deleted)
[facility] [address] [place] [address] [phone]  Date:  04/26/2016   Name:  Scott Schaefer   DOB:  1961-01-31   MRN:  [id number]  PCP:  No primary care provider on file.   Chief Complaint  Patient presents with  . Wound Check     History of Present Illness:  Scott Schaefer is a 55 y.o. male patient who presents to [facility] for wound check.      Patient Active Problem List   Diagnosis Date Noted  . Angioedema 10/16/2015  . Anxiety   . Type 2 diabetes mellitus without complication, without long-term current use of insulin ([place])   . Essential hypertension   . HYPERLIPIDEMIA 05/20/2009  . OBSTRUCTIVE SLEEP APNEA 05/20/2009  . HYPERTENSION 05/20/2009    Past Medical History  Diagnosis Date  . Anxiety   . Diabetes mellitus   . Hypertension   . Sleep apnea     has cpap but does not use    Past Surgical History  Procedure Laterality Date  . No prior surgeries      Social History  Substance Use Topics  . Smoking status: Current Every Day Smoker -- 0.80 packs/day    Types: Cigarettes  . Smokeless tobacco: Never Used  . Alcohol Use: 1.2 oz/week    2 Cans of beer per week    Family History  Problem Relation Age of Onset  . Colon cancer Neg Hx   . Stomach cancer Neg Hx   . Hypertension Sister   . Hypertension Brother   . Diabetes Maternal Grandmother     Allergies  Allergen Reactions  . Lisinopril Anaphylaxis  . Ace Inhibitors Other (See Comments)    Angioedema - history of angioedema in the past, history of ACE inhibitor use, questionable confounding factor with consumption of fish    Medication list has been reviewed and updated.  Current Outpatient Prescriptions on File Prior to Visit  Medication Sig Dispense Refill  . ALPRAZolam (XANAX) 1 MG tablet Take 1 mg by mouth 3 (three) times daily as needed for anxiety.     . diphenhydrAMINE (BENADRYL) 25 MG tablet Take 1 tablet (25 mg total) by mouth every 6 (six) hours as  needed for itching or allergies (also over the counter). 30 tablet 0  . famotidine (PEPCID) 20 MG tablet Take 1 tablet (20 mg total) by mouth 2 (two) times daily. While on steroid pack 30 tablet 0  . hydrochlorothiazide (HYDRODIURIL) 25 MG tablet Take 1 tablet (25 mg total) by mouth daily. 30 tablet 3  . HYDROcodone-acetaminophen (NORCO) 5-325 MG tablet Take 1 tablet by mouth every 6 (six) hours as needed. 10 tablet 0  . metFORMIN (GLUCOPHAGE) 500 MG tablet Take 500 mg by mouth 2 (two) times daily with a meal.     . PARoxetine (PAXIL) 20 MG tablet Take 20 mg by mouth every morning.      No current facility-administered medications on file prior to visit.    ROS   Physical Examination: BP 131/78 mmHg  Pulse 68  Temp(Src) 98.3 F (36.8 C) (Oral)  Resp 18  SpO2 98% Ideal Body Weight:    Physical Exam   Assessment and Plan: [name] is a 55 y.o. male who is here today  There are no diagnoses linked to this encounter.  Ivar Drape, PA-C [facility] 05/14/2016 4:15 PM

## 2016-05-14 NOTE — Progress Notes (Addendum)
Patient was seen for wound care.  This was removed with little difficulty.  Wound with beefy red tissue without much exudate.  clenased with normal saline.  Repacked.  Advised to rtc in 2 days, however he would have to rtc in 4.   Encounter for wound care  Ivar Drape, PA-C [facility] [place] 5/29/20174:30 PM

## 2016-05-27 ENCOUNTER — Emergency Department (HOSPITAL_COMMUNITY)
Admission: EM | Admit: 2016-05-27 | Discharge: 2016-05-27 | Disposition: A | Discharge status: Home / Self Care | Payer: BLUE CROSS/BLUE SHIELD | Attending: Emergency Medicine | Admitting: Emergency Medicine

## 2016-05-27 ENCOUNTER — Encounter (HOSPITAL_COMMUNITY): Payer: Self-pay | Admitting: *Deleted

## 2016-05-27 ENCOUNTER — Emergency Department (HOSPITAL_COMMUNITY): Payer: BLUE CROSS/BLUE SHIELD

## 2016-05-27 DIAGNOSIS — Z7984 Long term (current) use of oral hypoglycemic drugs: Secondary | ICD-10-CM | POA: Diagnosis not present

## 2016-05-27 DIAGNOSIS — I10 Essential (primary) hypertension: Secondary | ICD-10-CM | POA: Insufficient documentation

## 2016-05-27 DIAGNOSIS — R220 Localized swelling, mass and lump, head: Secondary | ICD-10-CM | POA: Diagnosis present

## 2016-05-27 DIAGNOSIS — F17210 Nicotine dependence, cigarettes, uncomplicated: Secondary | ICD-10-CM | POA: Diagnosis not present

## 2016-05-27 DIAGNOSIS — R4781 Slurred speech: Secondary | ICD-10-CM | POA: Diagnosis not present

## 2016-05-27 DIAGNOSIS — E119 Type 2 diabetes mellitus without complications: Secondary | ICD-10-CM | POA: Insufficient documentation

## 2016-05-27 DIAGNOSIS — Z79899 Other long term (current) drug therapy: Secondary | ICD-10-CM | POA: Diagnosis not present

## 2016-05-27 LAB — BASIC METABOLIC PANEL
Anion gap: 9 (ref 5–15)
BUN: 8 mg/dL (ref 6–20)
CALCIUM: 9.5 mg/dL (ref 8.9–10.3)
CO2: 25 mmol/L (ref 22–32)
Chloride: 102 mmol/L (ref 101–111)
Creatinine, Ser: 0.94 mg/dL (ref 0.61–1.24)
GFR calc Af Amer: >60 mL/min (ref >60)
Glucose, Bld: 126 mg/dL — ABNORMAL HIGH (ref 65–99)
Potassium: 4 mmol/L (ref 3.5–5.1)
SODIUM: 136 mmol/L (ref 135–145)

## 2016-05-27 LAB — CBC WITH DIFFERENTIAL/PLATELET
BASOS ABS: 0 10*3/uL (ref 0.0–0.1)
Basophils Relative: 0 %
EOS ABS: 0.1 10*3/uL (ref 0.0–0.7)
EOS PCT: 2 %
HCT: 38.8 % — ABNORMAL LOW (ref 39.0–52.0)
Hemoglobin: 13.3 g/dL (ref 13.0–17.0)
Lymphocytes Relative: 48 %
Lymphs Abs: 2.3 10*3/uL (ref 0.7–4.0)
MCH: 27.7 pg (ref 26.0–34.0)
MCHC: 34.3 g/dL (ref 30.0–36.0)
MCV: 80.7 fL (ref 78.0–100.0)
Monocytes Absolute: 0.4 10*3/uL (ref 0.1–1.0)
Monocytes Relative: 8 %
Neutro Abs: 2 10*3/uL (ref 1.7–7.7)
Neutrophils Relative %: 42 %
Platelets: 206 10*3/uL (ref 150–400)
RBC: 4.81 MIL/uL (ref 4.22–5.81)
RDW: 15 % (ref 11.5–15.5)
WBC: 4.9 10*3/uL (ref 4.0–10.5)

## 2016-05-27 MED ORDER — METHYLPREDNISOLONE SODIUM SUCC 125 MG IJ SOLR
125.0000 mg | Freq: Once | INTRAMUSCULAR | Status: AC
  Administered 2016-05-27: 125 mg via INTRAVENOUS
  Filled 2016-05-27: qty 2

## 2016-05-27 MED ORDER — DIPHENHYDRAMINE HCL 25 MG PO TABS: 25.0000 mg | ORAL_TABLET | Freq: Four times a day (QID) | ORAL | Status: DC | PRN

## 2016-05-27 MED ORDER — DIPHENHYDRAMINE HCL 50 MG/ML IJ SOLN
12.5000 mg | Freq: Once | INTRAMUSCULAR | Status: AC
  Administered 2016-05-27: 12.5 mg via INTRAVENOUS
  Filled 2016-05-27: qty 1

## 2016-05-27 MED ORDER — FAMOTIDINE IN NACL 20-0.9 MG/50ML-% IV SOLN
20.0000 mg | Freq: Once | INTRAVENOUS | Status: AC
  Administered 2016-05-27: 20 mg via INTRAVENOUS
  Filled 2016-05-27: qty 50

## 2016-05-27 MED ORDER — PREDNISONE 20 MG PO TABS: 40.0000 mg | ORAL_TABLET | Freq: Every day | ORAL | Status: DC

## 2016-05-27 NOTE — ED Notes (Signed)
PA at bedside.

## 2016-05-27 NOTE — ED Notes (Addendum)
Pt presents via POV c/o possible tongue swelling, pt woke up this AM and reports tongue is numb and feels swollen, also speech sounds muffled.  Family reports it was not normal this AM when he woke up.  A x 4, NAD.  Airway intact.   Pt reports hx anxiety-feels as if this may be anxiety although his mouth is dry which is unusual for him.   Pt took 12.5 benedryl this AM.

## 2016-05-27 NOTE — ED Provider Notes (Signed)
CSN: [id number]     Arrival date & time 05/27/16  [id number] History   First MD Initiated Contact with Patient 05/27/16 [phone]     Chief Complaint  Patient presents with  . Oral Swelling     (Consider location/radiation/quality/duration/timing/severity/associated sxs/prior Treatment) HPI Abi Brisbon is a 55 y.o. male with hx of anxiety, DM, HTN, presents to ED with complaint of tongue numbness and swelling. Pt states he woke up this morning to a phone call and noted that when he spoke his tongue "felt different." States feels like "tongue is dry and swollen." Pt reports similar sensation last October when he was admitted for angioedema due to possible fish vs lisinopril. Pt states he has not eaten since last night when he had a burger. Took his morning medications this morning and took 12.5 mg of benadryl at onset of symptoms. States tongue symptoms persisted so he decided to come to ED. Denies any lip or throat swelling. No rash. No itching. Denies numbness or weakness in extremities. No other complaints.   Past Medical History  Diagnosis Date  . Anxiety   . Diabetes mellitus   . Hypertension   . Sleep apnea     has cpap but does not use   Past Surgical History  Procedure Laterality Date  . No prior surgeries     Family History  Problem Relation Age of Onset  . Colon cancer Neg Hx   . Stomach cancer Neg Hx   . Hypertension Sister   . Hypertension Brother   . Diabetes Maternal Grandmother    Social History  Substance Use Topics  . Smoking status: Current Every Day Smoker -- 0.80 packs/day    Types: Cigarettes  . Smokeless tobacco: Never Used  . Alcohol Use: 1.2 oz/week    2 Cans of beer per week    Review of Systems  Constitutional: Negative for fever and chills.  HENT: Negative for facial swelling, sore throat, trouble swallowing and voice change.   Respiratory: Negative for cough, chest tightness and shortness of breath.   Cardiovascular: Negative for chest pain,  palpitations and leg swelling.  Gastrointestinal: Negative for nausea, vomiting, abdominal pain, diarrhea and abdominal distention.  Genitourinary: Negative for dysuria, urgency, frequency and hematuria.  Musculoskeletal: Negative for myalgias, arthralgias, neck pain and neck stiffness.  Skin: Negative for rash.  Allergic/Immunologic: Negative for immunocompromised state.  Neurological: Positive for speech difficulty. Negative for dizziness, weakness, light-headedness, numbness and headaches.  All other systems reviewed and are negative.     Allergies  Lisinopril and Ace inhibitors  Home Medications   Prior to Admission medications   Medication Sig Start Date End Date Taking? Authorizing Provider  ALPRAZolam Duanne Moron) 1 MG tablet Take 1 mg by mouth 3 (three) times daily as needed for anxiety.  02/03/12   Historical Provider, MD  diphenhydrAMINE (BENADRYL) 25 MG tablet Take 1 tablet (25 mg total) by mouth every 6 (six) hours as needed for itching or allergies (also over the counter). 10/17/15   Ripudeep Krystal Eaton, MD  famotidine (PEPCID) 20 MG tablet Take 1 tablet (20 mg total) by mouth 2 (two) times daily. While on steroid pack 10/17/15   Ripudeep Krystal Eaton, MD  hydrochlorothiazide (HYDRODIURIL) 25 MG tablet Take 1 tablet (25 mg total) by mouth daily. 10/17/15   Ripudeep Krystal Eaton, MD  HYDROcodone-acetaminophen (NORCO) 5-325 MG tablet Take 1 tablet by mouth every 6 (six) hours as needed. 04/24/16   Ezekiel Slocumb, PA-C  metFORMIN (GLUCOPHAGE) 500 MG tablet  Take 500 mg by mouth 2 (two) times daily with a meal.  12/07/11   Historical Provider, MD  PARoxetine (PAXIL) 20 MG tablet Take 20 mg by mouth every morning.  01/29/12   Historical Provider, MD   BP 154/95 mmHg  Pulse 74  Temp(Src) 97.7 F (36.5 C) (Oral)  Resp 16  SpO2 100% Physical Exam  Constitutional: He is oriented to person, place, and time. He appears well-developed and well-nourished. No distress.  HENT:  Head: Normocephalic and  atraumatic.  No obvious swelling of the lips, tongue, uvula, oropharynx.  Eyes: Conjunctivae are normal.  Neck: Normal range of motion. Neck supple.  Cardiovascular: Normal rate, regular rhythm and normal heart sounds.   Pulmonary/Chest: Effort normal. No respiratory distress. He has no wheezes. He has no rales.  No stridor  Abdominal: Soft. Bowel sounds are normal. He exhibits no distension. There is no tenderness. There is no rebound.  Musculoskeletal: He exhibits no edema.  Neurological: He is alert and oriented to person, place, and time.  5/5 and equal upper and lower extremity strength bilaterally. Equal grip strength bilaterally. Normal finger to nose and heel to shin. No pronator drift. Patellar reflexes 2+   Skin: Skin is warm and dry.  Nursing note and vitals reviewed.   ED Course  Procedures (including critical care time) Labs Review Labs Reviewed  CBC WITH DIFFERENTIAL/PLATELET - Abnormal; Notable for the following:    HCT 38.8 (*)    All other components within normal limits  BASIC METABOLIC PANEL - Abnormal; Notable for the following:    Glucose, Bld 126 (*)    All other components within normal limits    Imaging Review Ct Head Wo Contrast  05/27/2016  CLINICAL DATA:  55 year old male with new onset slurred speech and tongue numbness EXAM: CT HEAD WITHOUT CONTRAST TECHNIQUE: Contiguous axial images were obtained from the base of the skull through the vertex without intravenous contrast. COMPARISON:  None. FINDINGS: Negative for acute intracranial hemorrhage, acute infarction, mass, mass effect, hydrocephalus or midline shift. Gray-white differentiation is preserved throughout. No acute soft tissue or calvarial abnormality. The globes and orbits are symmetric and unremarkable. Normal aeration of the mastoid air cells and visualized paranasal sinuses. IMPRESSION: Negative head CT. Electronically Signed   By: Jacqulynn Cadet M.D.   On: 05/27/2016 09:58   I have personally  reviewed and evaluated these images and lab results as part of my medical decision-making.   EKG Interpretation   Date/Time:  Sunday May 27 2016 09:41:06 EDT Ventricular Rate:  71 PR Interval:  160 QRS Duration: 90 QT Interval:  372 QTC Calculation: 404 R Axis:   62 Text Interpretation:  Sinus rhythm ST elev, probable normal early repol  pattern No significant change since last tracing Confirmed by KNAPP  MD-J,  JON [id number]) on 05/27/2016 9:49:23 AM      MDM   Final diagnoses:  Slurred speech    Patient emergency department with sensation of tongue dryness, numbness, swelling. No obvious swelling noted on exam. Patient did have angioedema last October due to fish versus ACE inhibitor. He is not on ACE inhibitor at this time and no recent fish ingestion. Patient's speech is slurred on my exam. His significant other confirms that his speech does not sound normal. He has no other neuro deficits on exam. We will get CT head, labs, we'll try some medication for possible allergic reaction.  12:30 PM Patient watched for 3-1/2 hours, states tongue is back to normal. CT and  labs negative. At this time given resolution of the symptoms, and prior similar episode with angioedema, question if this could be related to mild tongue swelling versus anxiety versus TIA. Patient wants to go home. I will discharge him home with strict return precautions. Discussed with the wife as well, instructed to return to ER if increased slurred speech or tongue swelling or difficulty breathing or any concerning symptom. Patient and family member voiced understanding  Filed Vitals:   05/27/16 1030 05/27/16 1130 05/27/16 1200 05/27/16 1305  BP: 134/61 117/62 150/77 142/94  Pulse: 70 67 66 63  Temp:    97.9 F (36.6 C)  TempSrc:    Oral  Resp: 18 16 17 13   SpO2: 96% 98% 96% 97%     Jeannett Senior, PA-C 05/27/16 1548  Dorie Rank, MD 05/28/16 1504

## 2016-05-27 NOTE — Discharge Instructions (Signed)
Continue Benadryl as prescribed every 6 hours for the next 3 days. Take prednisone as prescribed until all gone. Please return to the emergency department if worsening slurred speech, any new numbness or weakness to extremities, changes in vision, headache, increased swelling in the tongue, lips, throat, difficulty breathing, any new concerning symptom  Angioedema Angioedema is a sudden swelling of tissues, often of the skin. It can occur on the face or genitals or in the abdomen or other body parts. The swelling usually develops over a short period and gets better in 24 to 48 hours. It often begins during the night and is found when the person wakes up. The person may also get red, itchy patches of skin (hives). Angioedema can be dangerous if it involves swelling of the air passages.  Depending on the cause, episodes of angioedema may only happen once, come back in unpredictable patterns, or repeat for several years and then gradually fade away.  CAUSES  Angioedema can be caused by an allergic reaction to various triggers. It can also result from nonallergic causes, including reactions to drugs, immune system disorders, viral infections, or an abnormal gene that is passed to you from your parents (hereditary). For some people with angioedema, the cause is unknown.  Some things that can trigger angioedema include:   Foods.   Medicines, such as ACE inhibitors, ARBs, nonsteroidal anti-inflammatory agents, or estrogen.   Latex.   Animal saliva.   Insect stings.   Dyes used in X-rays.   Mild injury.   Dental work.  Surgery.  Stress.   Sudden changes in temperature.   Exercise. SIGNS AND SYMPTOMS   Swelling of the skin.  Hives. If these are present, there is also intense itching.  Redness in the affected area.   Pain in the affected area.  Swollen lips or tongue.  Breathing problems. This may happen if the air passages swell.  Wheezing. If internal organs are  involved, there may be:   Nausea.   Abdominal pain.   Vomiting.   Difficulty swallowing.   Difficulty passing urine. DIAGNOSIS   Your health care provider will examine the affected area and take a medical and family history.  Various tests may be done to help determine the cause. Tests may include:  Allergy skin tests to see if the problem is an allergic reaction.   Blood tests to check for hereditary angioedema.   Tests to check for underlying diseases that could cause the condition.   A review of your medicines, including over-the-counter medicines, may be done. TREATMENT  Treatment will depend on the cause of the angioedema. Possible treatments include:   Removal of anything that triggered the condition (such as stopping certain medicines).   Medicines to treat symptoms or prevent attacks. Medicines given may include:   Antihistamines.   Epinephrine injection.   Steroids.   Hospitalization may be required for severe attacks. If the air passages are affected, it can be an emergency. Tubes may need to be placed to keep the airway open. HOME CARE INSTRUCTIONS   Take all medicines as directed by your health care provider.  If you were given medicines for emergency allergy treatment, always carry them with you.  Wear a medical bracelet as directed by your health care provider.   Avoid known triggers. SEEK MEDICAL CARE IF:   You have repeat attacks of angioedema.   Your attacks are more frequent or more severe despite preventive measures.   You have hereditary angioedema and are considering having  children. It is important to discuss with your health care provider the risks of passing the condition on to your children. SEEK IMMEDIATE MEDICAL CARE IF:   You have severe swelling of the mouth, tongue, or lips.  You have difficulty breathing.   You have difficulty swallowing.   You faint. MAKE SURE YOU:  Understand these instructions.  Will  watch your condition.  Will get help right away if you are not doing well or get worse.   This information is not intended to replace advice given to you by your health care provider. Make sure you discuss any questions you have with your health care provider.   Document Released: 02/11/2002 Document Revised: 12/24/2014 Document Reviewed: 07/27/2013 Elsevier Interactive Patient Education [employer].

## 2016-08-21 DIAGNOSIS — F422 Mixed obsessional thoughts and acts: Secondary | ICD-10-CM | POA: Diagnosis not present

## 2016-08-27 DIAGNOSIS — M19021 Primary osteoarthritis, right elbow: Secondary | ICD-10-CM | POA: Diagnosis not present

## 2016-10-11 ENCOUNTER — Ambulatory Visit (INDEPENDENT_AMBULATORY_CARE_PROVIDER_SITE_OTHER): Payer: BLUE CROSS/BLUE SHIELD | Admitting: Urgent Care

## 2016-10-11 VITALS — BP 160/88 | HR 85 | Temp 98.5°F | Resp 18 | Ht 74.0 in | Wt 236.8 lb

## 2016-10-11 DIAGNOSIS — R350 Frequency of micturition: Secondary | ICD-10-CM | POA: Diagnosis not present

## 2016-10-11 DIAGNOSIS — R32 Unspecified urinary incontinence: Secondary | ICD-10-CM | POA: Diagnosis not present

## 2016-10-11 LAB — COMPLETE METABOLIC PANEL WITH GFR
ALBUMIN: 4.9 g/dL (ref 3.6–5.1)
ALK PHOS: 88 U/L (ref 40–115)
ALT: 18 U/L (ref 9–46)
AST: 17 U/L (ref 10–35)
BILIRUBIN TOTAL: 0.4 mg/dL (ref 0.2–1.2)
BUN: 9 mg/dL (ref 7–25)
CALCIUM: 9.7 mg/dL (ref 8.6–10.3)
CO2: 24 mmol/L (ref 20–31)
CREATININE: 0.87 mg/dL (ref 0.70–1.33)
Chloride: 101 mmol/L (ref 98–110)
GFR, Est African American: >89 mL/min (ref >=60)
GFR, Est Non African American: >89 mL/min (ref >=60)
GLUCOSE: 111 mg/dL — AB (ref 65–99)
Potassium: 4 mmol/L (ref 3.5–5.3)
SODIUM: 136 mmol/L (ref 135–146)
TOTAL PROTEIN: 7.7 g/dL (ref 6.1–8.1)

## 2016-10-11 LAB — POCT UA - MICROSCOPIC ONLY

## 2016-10-11 LAB — POCT URINALYSIS DIP (MANUAL ENTRY)
Bilirubin, UA: NEGATIVE
GLUCOSE UA: NEGATIVE
Ketones, POC UA: NEGATIVE
Leukocytes, UA: NEGATIVE
NITRITE UA: NEGATIVE
PROTEIN UA: NEGATIVE
RBC UA: NEGATIVE
SPEC GRAV UA: 1.015
UROBILINOGEN UA: 0.2
pH, UA: 7

## 2016-10-11 LAB — HEMOGLOBIN A1C
Hgb A1c MFr Bld: 6.7 % — ABNORMAL HIGH (ref <5.7)
Mean Plasma Glucose: 146 mg/dL

## 2016-10-11 LAB — PSA: PSA: 1.8 ng/mL (ref <=4.0)

## 2016-10-11 LAB — CBC
HEMATOCRIT: 40.2 % (ref 38.5–50.0)
HEMOGLOBIN: 14.1 g/dL (ref 13.2–17.1)
MCH: 27.9 pg (ref 27.0–33.0)
MCHC: 35.1 g/dL (ref 32.0–36.0)
MCV: 79.6 fL — ABNORMAL LOW (ref 80.0–100.0)
MPV: 9 fL (ref 7.5–12.5)
Platelets: 251 10*3/uL (ref 140–400)
RBC: 5.05 MIL/uL (ref 4.20–5.80)
RDW: 14.5 % (ref 11.0–15.0)
WBC: 4.8 10*3/uL (ref 3.8–10.8)

## 2016-10-11 NOTE — Patient Instructions (Addendum)
Urinary Incontinence Urinary incontinence is the involuntary loss of urine from your bladder. CAUSES  There are many causes of urinary incontinence. They include:  Medicines.  Infections.  Prostatic enlargement, leading to overflow of urine from your bladder.  Surgery.  Neurological diseases.  Emotional factors. SIGNS AND SYMPTOMS Urinary Incontinence can be divided into four types: 1. Urge incontinence. Urge incontinence is the involuntary loss of urine before you have the opportunity to go to the bathroom. There is a sudden urge to void but not enough time to reach a bathroom. 2. Stress incontinence. Stress incontinence is the sudden loss of urine with any activity that forces urine to pass. It is commonly caused by anatomical changes to the pelvis and sphincter areas of your body. 3. Overflow incontinence. Overflow incontinence is the loss of urine from an obstructed opening to your bladder. This results in a backup of urine and a resultant buildup of pressure within the bladder. When the pressure within the bladder exceeds the closing pressure of the sphincter, the urine overflows, which causes incontinence, similar to water overflowing a dam. 4. Total incontinence. Total incontinence is the loss of urine as a result of the inability to store urine within your bladder. DIAGNOSIS  Evaluating the cause of incontinence may require:  A thorough and complete medical and obstetric history.  A complete physical exam.  Laboratory tests such as a urine culture and sensitivities. When additional tests are indicated, they can include:  An ultrasound exam.  Kidney and bladder X-rays.  Cystoscopy. This is an exam of the bladder using a narrow scope.  Urodynamic testing to test the nerve function to the bladder and sphincter areas. TREATMENT  Treatment for urinary incontinence depends on the cause:  For urge incontinence caused by a bacterial infection, antibiotics will be prescribed.  If the urge incontinence is related to medicines you take, your health care provider may have you change the medicine.  For stress incontinence, surgery to re-establish anatomical support to the bladder or sphincter, or both, will often correct the condition.  For overflow incontinence caused by an enlarged prostate, an operation to open the channel through the enlarged prostate will allow the flow of urine out of the bladder. In women with fibroids, a hysterectomy may be recommended.  For total incontinence, surgery on your urinary sphincter may help. An artificial urinary sphincter (an inflatable cuff placed around the urethra) may be required. In women who have developed a hole-like passage between their bladder and vagina (vesicovaginal fistula), surgery to close the fistula often is required. HOME CARE INSTRUCTIONS  Normal daily hygiene and the use of pads or adult diapers that are changed regularly will help prevent odors and skin damage.  Avoid caffeine. It can overstimulate your bladder.  Use the bathroom regularly. Try about every 2-3 hours to go to the bathroom, even if you do not feel the need to do so. Take time to empty your bladder completely. After urinating, wait a minute. Then try to urinate again.  For causes involving nerve dysfunction, keep a log of the medicines you take and a journal of the times you go to the bathroom. SEEK MEDICAL CARE IF:  You experience worsening of pain instead of improvement in pain after your procedure.  Your incontinence becomes worse instead of better. SEE IMMEDIATE MEDICAL CARE IF:  You experience fever or shaking chills.  You are unable to pass your urine.  You have redness spreading into your groin or down into your thighs. MAKE SURE   YOU:   Understand these instructions.   Will watch your condition.  Will get help right away if you are not doing well or get worse.   This information is not intended to replace advice given to you  by your health care provider. Make sure you discuss any questions you have with your health care provider.   Document Released: 01/10/2005 Document Revised: 12/24/2014 Document Reviewed: 05/12/2013 Elsevier Interactive Patient Education 2016 [employer].     IF you received an x-ray today, you will receive an invoice from [facility]. Please contact [facility] at [phone] with questions or concerns regarding your invoice.   IF you received labwork today, you will receive an invoice from [employer]. Please contact [facility] at [phone] with questions or concerns regarding your invoice.   Our billing staff will not be able to assist you with questions regarding bills from these companies.  You will be contacted with the lab results as soon as they are available. The fastest way to get your results is to activate your My Chart account. Instructions are located on the last page of this paperwork. If you have not heard from [place] regarding the results in 2 weeks, please contact this office.

## 2016-10-11 NOTE — Progress Notes (Addendum)
MRN: [id number] DOB: Jan 11, 1961  Subjective:  This chart was scribed for PA Bess Harvest, by [employer], at [facility].  This patient was seen in room 8 and the patient's care was started at 8:36 AM.   Scott Schaefer is a 55 y.o. male presenting for chief complaint of Urinary Incontinence  Reports 2 month history of frequency and urinary incontinence. Has been wearing adult depends due to "leaking" after urinating.  Has been having reoccurring bed wetting which has also been going on for 2 months and feels that this is the biggest issue for him.  Notes of having to urinate 15 times daily.  Patient is currently in a  monogamous relationship. Has had STD testing in the past which came back negative. He has also had a PSA test completed in the past which came back without any concerns. He will be having his colonoscopy completed in 2018.    Patient smokes 1/2 pack per day.   Blood pressure: He has taken his blood pressure medication this morning and is compliant with his Amlodipine (unsure of the dosage). His blood pressure is managed by the health care providers at his work place. He was taken off of lisinopril for angioedema and reports that they were able to control his BP with HCTZ, amlodipine. Has not had recent f/u.   Current Outpatient Prescriptions on File Prior to Visit  Medication Sig Dispense Refill   ALPRAZolam (XANAX) 1 MG tablet Take 1 mg by mouth 3 (three) times daily as needed for anxiety.      diphenhydrAMINE (BENADRYL) 25 MG tablet Take 1 tablet (25 mg total) by mouth every 6 (six) hours as needed. 30 tablet 0   famotidine (PEPCID) 20 MG tablet Take 1 tablet (20 mg total) by mouth 2 (two) times daily. While on steroid pack 30 tablet 0   hydrochlorothiazide (HYDRODIURIL) 25 MG tablet Take 1 tablet (25 mg total) by mouth daily. 30 tablet 3   PARoxetine (PAXIL) 20 MG tablet Take 20 mg by mouth every morning.      No current facility-administered  medications on file prior to visit.     Also is allergic to lisinopril and ace inhibitors.  Scott Schaefer  has a past medical history of Anxiety; Diabetes mellitus; Hypertension; and Sleep apnea. Also  has a past surgical history that includes no prior surgeries.   Review of Systems  Constitutional: Negative for chills, fever and weight loss.  Cardiovascular: Negative for chest pain, palpitations and leg swelling.  Gastrointestinal: Negative for abdominal pain, nausea and vomiting.  Genitourinary: Negative for hematuria.  Neurological: Negative for dizziness.  Negative for odorous cloudy urine, rectal or anal pain, pelvic fullness or penile discharge.   Objective:   Vitals: BP (!) 156/90 (BP Location: Right Arm, Patient Position: Sitting, Cuff Size: Large)    Pulse 85    Temp 98.5 F (36.9 C) (Oral)    Resp 18    Ht 6\' 2"  (1.88 m)    Wt 236 lb 12.8 oz (107.4 kg)    SpO2 99%    BMI 30.40 kg/m   Physical Exam  Constitutional: He is oriented to person, place, and time. He appears well-developed and well-nourished.  Cardiovascular: Normal rate, regular rhythm, normal heart sounds and intact distal pulses.  Exam reveals no gallop and no friction rub.   No murmur heard. Pulmonary/Chest: Effort normal and breath sounds normal. No respiratory distress. He has no wheezes. He has no rales.  Abdominal: Soft. Bowel  sounds are normal. He exhibits no distension and no mass. There is no tenderness. There is no guarding.  Genitourinary: Prostate normal. Prostate is not enlarged and not tender.  Musculoskeletal: He exhibits no edema.  Neurological: He is alert and oriented to person, place, and time.  Skin: Skin is warm and dry.   Results for orders placed or performed in visit on 10/11/16 (from the past 24 hour(s))  POCT urinalysis dipstick     Status: None   Collection Time: 10/11/16  8:57 AM  Result Value Ref Range   Color, UA yellow yellow   Clarity, UA clear clear   Glucose, UA negative negative    Bilirubin, UA negative negative   Ketones, POC UA negative negative   Spec Grav, UA 1.015    Blood, UA negative negative   pH, UA 7.0    Protein Ur, POC negative negative   Urobilinogen, UA 0.2    Nitrite, UA Negative Negative   Leukocytes, UA Negative Negative  POCT UA - Microscopic Only     Status: None   Collection Time: 10/11/16  9:06 AM  Result Value Ref Range   WBC, Ur, HPF, POC none    RBC, urine, microscopic none    Bacteria, U Microscopic none    Mucus, UA none    Epithelial cells, urine per micros none    Crystals, Ur, HPF, POC none    Casts, Ur, LPF, POC none    Yeast, UA none    Assessment and Plan :   1. Urinary incontinence, unspecified type 2. Frequency of urination - Physical exam findings reassuring, labs pending, will refer to urology for consult, cystoscopy. Patient is in agreement.  Jaynee Eagles, PA-C [facility] [phone] 10/11/2016 8:36 AM

## 2016-10-12 ENCOUNTER — Encounter: Payer: Self-pay | Admitting: Urgent Care

## 2016-11-22 DIAGNOSIS — H16141 Punctate keratitis, right eye: Secondary | ICD-10-CM | POA: Diagnosis not present

## 2016-12-01 DIAGNOSIS — E119 Type 2 diabetes mellitus without complications: Secondary | ICD-10-CM | POA: Diagnosis not present

## 2016-12-01 DIAGNOSIS — H524 Presbyopia: Secondary | ICD-10-CM | POA: Diagnosis not present

## 2017-01-22 DIAGNOSIS — F422 Mixed obsessional thoughts and acts: Secondary | ICD-10-CM | POA: Diagnosis not present

## 2017-02-25 DIAGNOSIS — R350 Frequency of micturition: Secondary | ICD-10-CM | POA: Diagnosis not present

## 2017-02-25 DIAGNOSIS — N5201 Erectile dysfunction due to arterial insufficiency: Secondary | ICD-10-CM | POA: Diagnosis not present

## 2017-02-25 DIAGNOSIS — R972 Elevated prostate specific antigen [PSA]: Secondary | ICD-10-CM | POA: Diagnosis not present

## 2017-02-25 DIAGNOSIS — N401 Enlarged prostate with lower urinary tract symptoms: Secondary | ICD-10-CM | POA: Diagnosis not present

## 2017-03-14 ENCOUNTER — Encounter: Payer: Self-pay | Admitting: Gastroenterology

## 2017-04-28 IMAGING — CT CT HEAD W/O CM
4 series · 17 of 47 positions shown, 19 images · non-contrast
Comparison: None.

CLINICAL DATA: 55-year-old male with new onset slurred speech and
tongue numbness

EXAM:
CT HEAD WITHOUT CONTRAST
TECHNIQUE: Contiguous axial images were obtained from the base of the skull
through the vertex without intravenous contrast.

[Series 2: head without · axial · non-contrast · 0.46mm/px · z∈[+1595,+1715]mm · 7 of 34 slices shown, 9 images]
[im 5/34  brain]
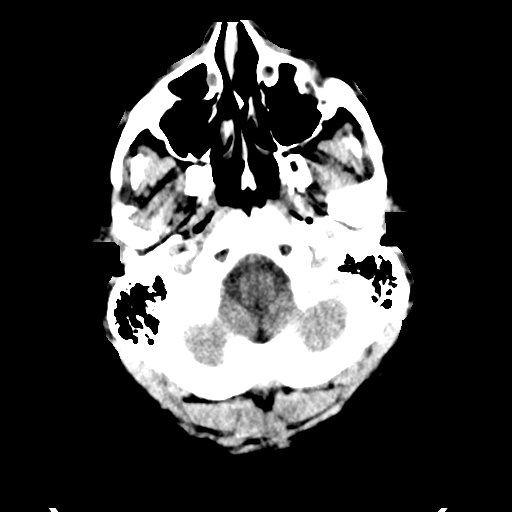
[im 5/34  bone]
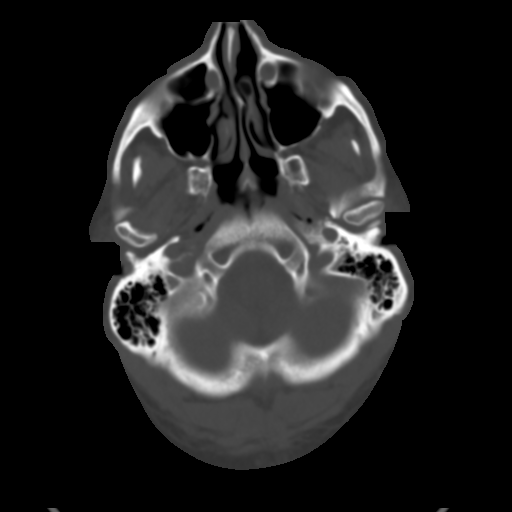
[im 9/34  brain]
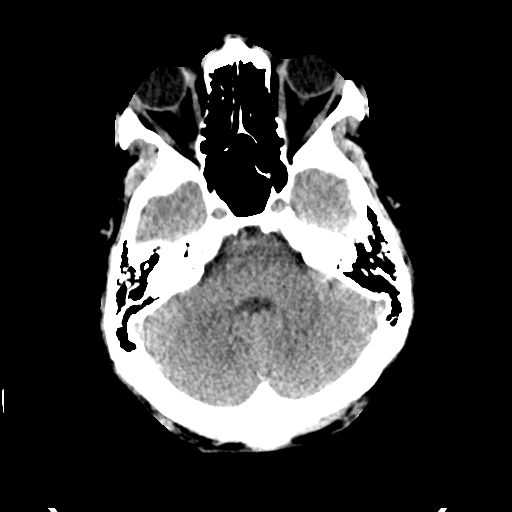
[im 13/34  brain]
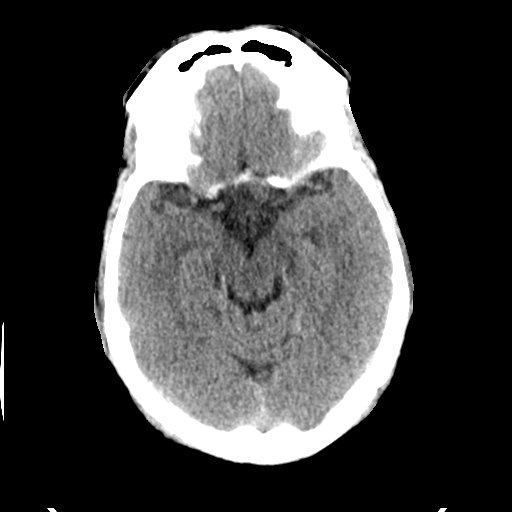
[im 17/34  brain]
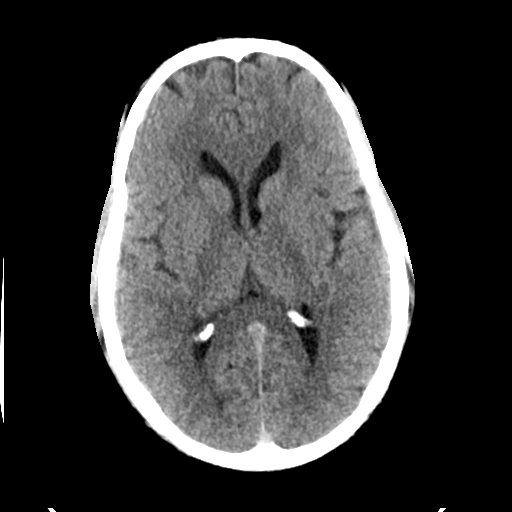
[im 21/34  brain]
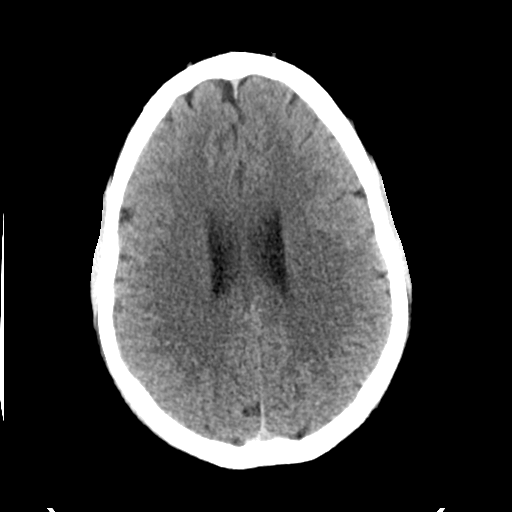
[im 21/34  bone]
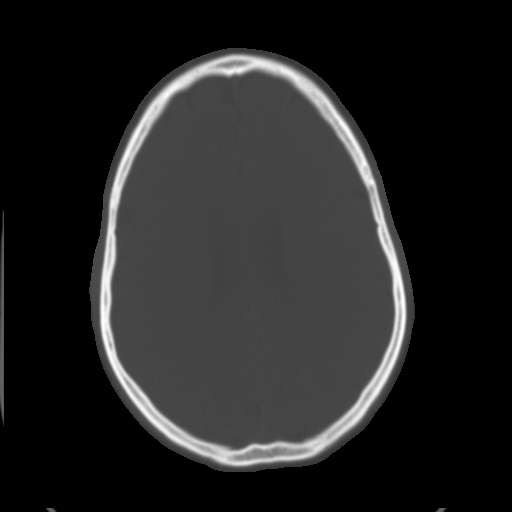
[im 25/34  brain]
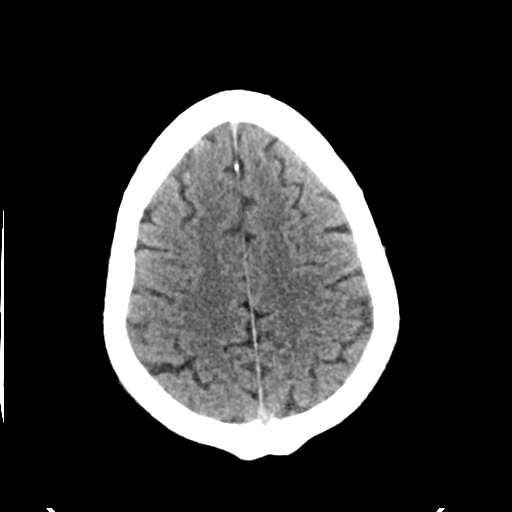
[im 29/34  brain]
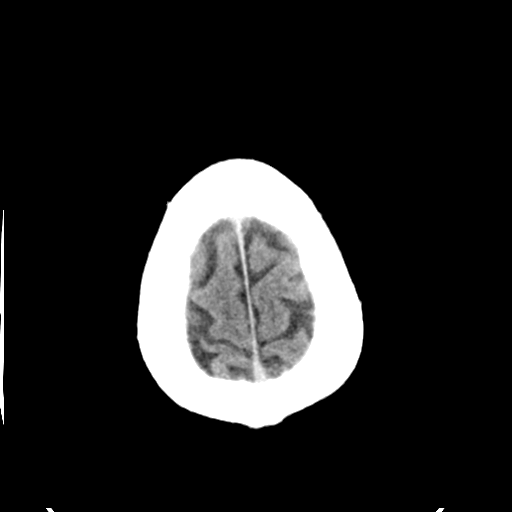

[Series 3: head bone · axial · 0.46mm/px · z∈[+1591,+1649]mm · 4 of 84 slices shown]
[im 9/84  bone]
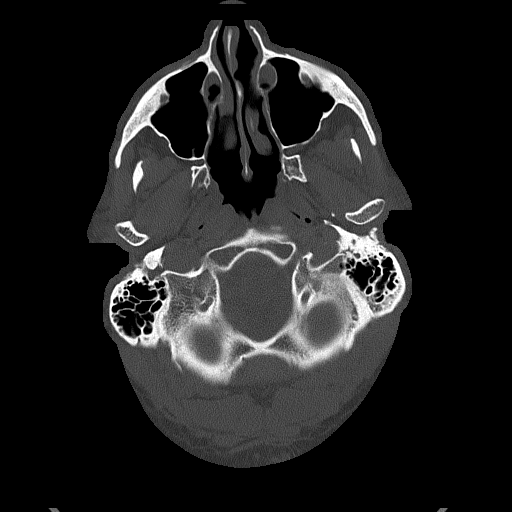
[im 17/84  bone]
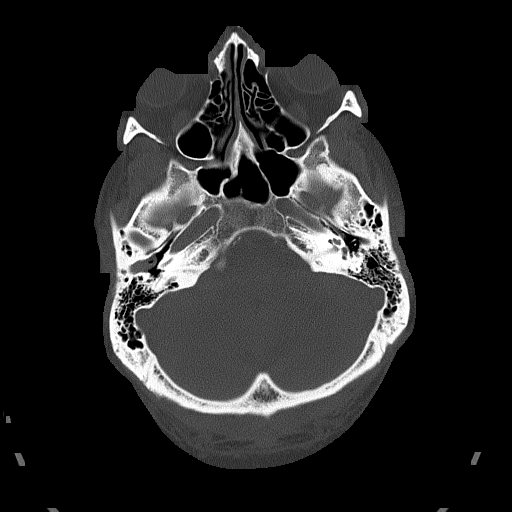
[im 25/84  bone]
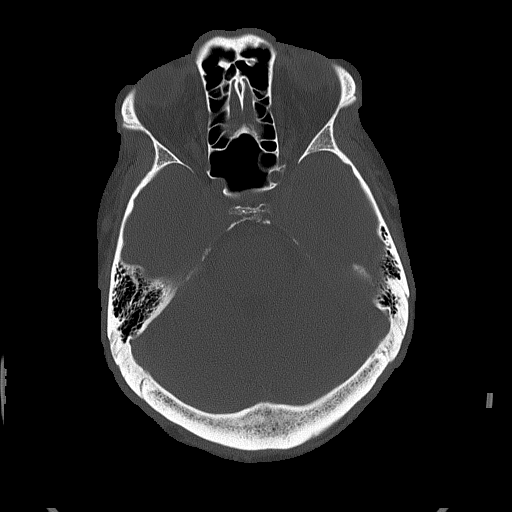
[im 38/84  bone]
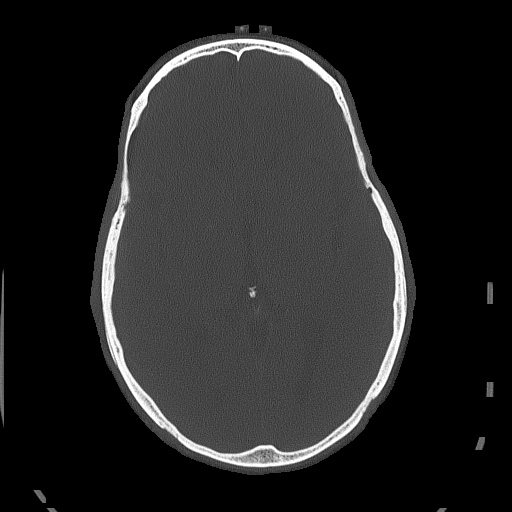

[Series 4: head without cor · coronal · non-contrast · 0.32mm/px · 3 of 77 slices shown]
[im 26/77  brain]
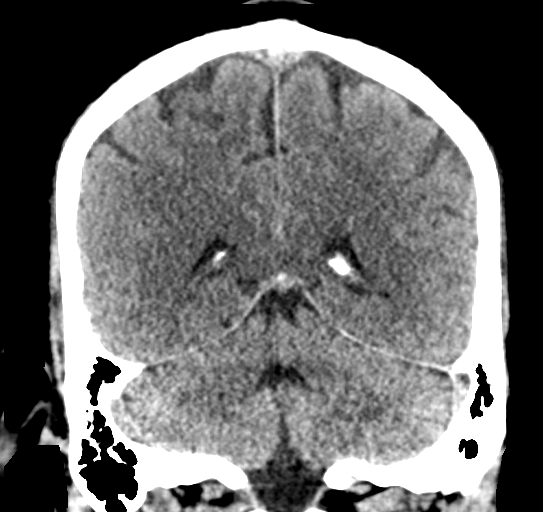
[im 34/77  brain]
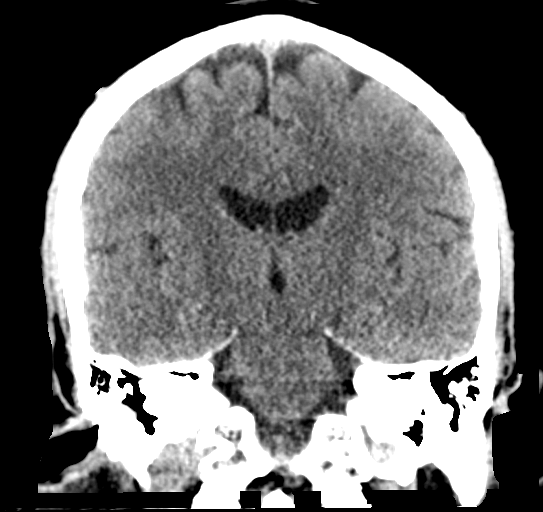
[im 43/77  brain]
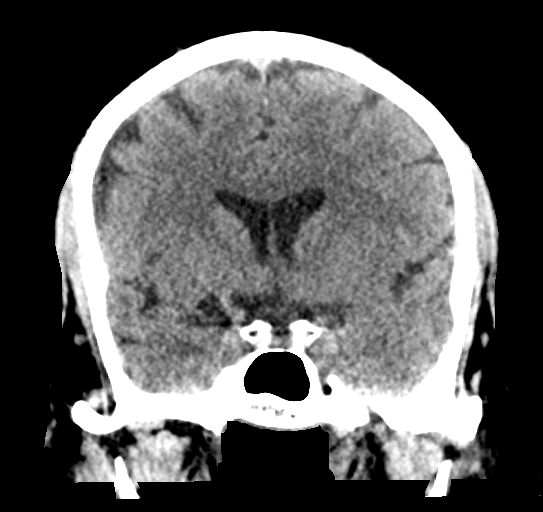

[Series 5: head without sag · sagittal · non-contrast · 0.39mm/px · 3 of 67 slices shown]
[im 23/67  brain]
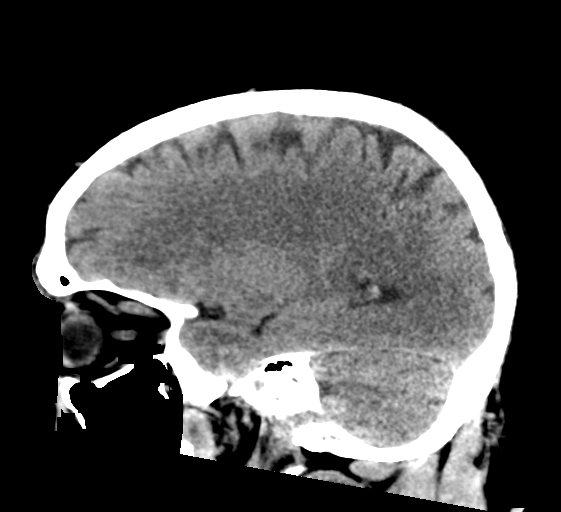
[im 34/67  brain]
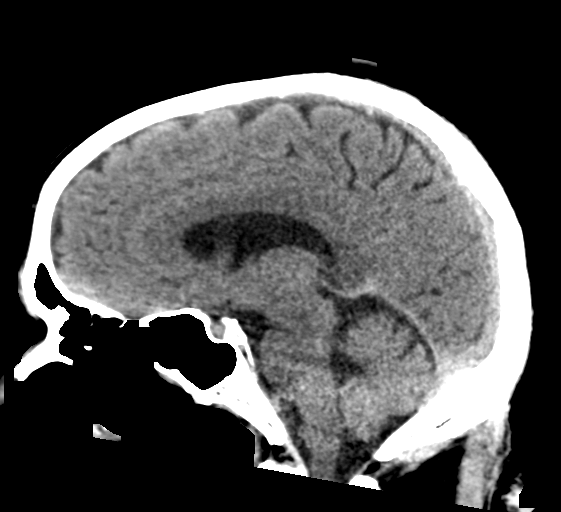
[im 45/67  brain]
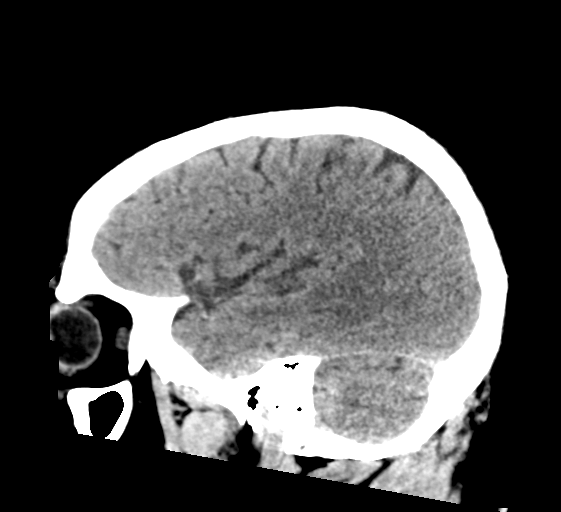

[17 of 47 positions shown; findings below may reference images not displayed]

FINDINGS: Negative for acute intracranial hemorrhage, acute infarction, mass,
mass effect, hydrocephalus or midline shift. Gray-white
differentiation is preserved throughout. No acute soft tissue or
calvarial abnormality. The globes and orbits are symmetric and
unremarkable. Normal aeration of the mastoid air cells and
visualized paranasal sinuses.
IMPRESSION: Negative head CT.

## 2017-05-20 ENCOUNTER — Ambulatory Visit: Payer: BLUE CROSS/BLUE SHIELD

## 2017-05-20 VITALS — Ht 74.0 in | Wt 239.2 lb

## 2017-05-20 DIAGNOSIS — Z860100 Personal history of colon polyps, unspecified: Secondary | ICD-10-CM

## 2017-05-20 MED ORDER — SUPREP BOWEL PREP KIT 17.5-3.13-1.6 GM/177ML PO SOLN: 1.0000 | Freq: Once | ORAL | 0 refills | Status: AC

## 2017-05-20 NOTE — Progress Notes (Signed)
No allergies to eggs or soy No past problems with anesthesia No diet meds No home oxygen  Registered emmi 

## 2017-05-21 ENCOUNTER — Encounter: Payer: Self-pay | Admitting: Gastroenterology

## 2017-06-03 ENCOUNTER — Encounter: Payer: Self-pay | Admitting: Gastroenterology

## 2017-06-03 ENCOUNTER — Ambulatory Visit (AMBULATORY_SURGERY_CENTER): Payer: BLUE CROSS/BLUE SHIELD | Admitting: Gastroenterology

## 2017-06-03 VITALS — BP 110/74 | HR 69 | Temp 98.4°F | Resp 18 | Ht 74.0 in | Wt 239.0 lb

## 2017-06-03 DIAGNOSIS — Z860100 Personal history of colon polyps, unspecified: Secondary | ICD-10-CM

## 2017-06-03 DIAGNOSIS — D125 Benign neoplasm of sigmoid colon: Secondary | ICD-10-CM

## 2017-06-03 DIAGNOSIS — K635 Polyp of colon: Secondary | ICD-10-CM

## 2017-06-03 MED ORDER — SODIUM CHLORIDE 0.9 % IV SOLN: 500.0000 mL | INTRAVENOUS | Status: DC

## 2017-06-03 NOTE — Op Note (Signed)
[facility] Patient Name: Scott Schaefer Procedure Date: 06/03/2017 7:17 AM MRN: [id number] Endoscopist: Remo Lipps P. Armbruster MD, MD Age: 56 Referring MD:  Date of Birth: Jun 11, 1961 Gender: Male Account #: [id number] Procedure:                Colonoscopy Indications:              High risk colon cancer surveillance: Personal                            history of colonic polyps Medicines:                Monitored Anesthesia Care Procedure:                Pre-Anesthesia Assessment:                           - Prior to the procedure, a History and Physical                            was performed, and patient medications and                            allergies were reviewed. The patient's tolerance of                            previous anesthesia was also reviewed. The risks                            and benefits of the procedure and the sedation                            options and risks were discussed with the patient.                            All questions were answered, and informed consent                            was obtained. Prior Anticoagulants: The patient has                            taken no previous anticoagulant or antiplatelet                            agents. ASA Grade Assessment: III - A patient with                            severe systemic disease. After reviewing the risks                            and benefits, the patient was deemed in                            satisfactory condition to undergo the procedure.  After obtaining informed consent, the colonoscope                            was passed under direct vision. Throughout the                            procedure, the patient's blood pressure, pulse, and                            oxygen saturations were monitored continuously. The                            Colonoscope was introduced through the anus and                            advanced to the the cecum,  identified by                            appendiceal orifice and ileocecal valve. The                            colonoscopy was performed without difficulty. The                            patient tolerated the procedure well. The quality                            of the bowel preparation was fair. The ileocecal                            valve, appendiceal orifice, and rectum were                            photographed. Scope In: 8:00:44 AM Scope Out: 8:25:50 AM Scope Withdrawal Time: 0 hours 16 minutes 27 seconds  Total Procedure Duration: 0 hours 25 minutes 6 seconds  Findings:                 The perianal and digital rectal examinations were                            normal.                           A 4 mm polyp was found in the sigmoid colon. The                            polyp was sessile. The polyp was removed with a                            cold snare. Resection and retrieval were complete.                           A large amount of liquid stool was found in the  entire colon, making visualization difficult.                            Lavage of the area was performed using copious                            amounts of sterile water, resulting in clearance                            with adequate visualization following lavage.                            Following this intervention the examination was                            adequate for screening purposes.                           Anal papilla(e) were hypertrophied.                           Internal hemorrhoids were found during retroflexion.                           The exam was otherwise without abnormality. Complications:            No immediate complications. Estimated blood loss:                            Minimal. Estimated Blood Loss:     Estimated blood loss was minimal. Impression:               - Preparation of the colon was fair, time taken to                            lavage  the colon during insertion and withdrawal to                            clear the colon and eventually obtain adequate                            views.                           - One 4 mm polyp in the sigmoid colon, removed with                            a cold snare. Resected and retrieved.                           - Stool in the entire examined colon.                           - Anal papilla(e) were hypertrophied.                           -  Internal hemorrhoids.                           - The examination was otherwise normal. Recommendation:           - Patient has a contact number available for                            emergencies. The signs and symptoms of potential                            delayed complications were discussed with the                            patient. Return to normal activities tomorrow.                            Written discharge instructions were provided to the                            patient.                           - Resume previous diet.                           - Continue present medications.                           - Await pathology results.                           - Repeat colonoscopy is recommended for                            surveillance. The colonoscopy date will be                            determined after pathology results from today's                            exam become available for review.                           - Double prep recommended for next colonoscopy                           - No ibuprofen, naproxen, or other non-steroidal                            anti-inflammatory drugs for 2 weeks after polyp                            removal. Remo Lipps P. Armbruster MD, MD 06/03/2017 8:29:58 AM This report has been signed electronically.

## 2017-06-03 NOTE — Progress Notes (Signed)
Called to room to assist during endoscopic procedure.  Patient ID and intended procedure confirmed with present staff. Received instructions for my participation in the procedure from the performing physician.  

## 2017-06-03 NOTE — Progress Notes (Signed)
A and O x3. Report to RN. Tolerated MAC anesthesia well.

## 2017-06-03 NOTE — Patient Instructions (Signed)
YOU HAD AN ENDOSCOPIC PROCEDURE TODAY AT [facility]:   Refer to the procedure report that was given to you for any specific questions about what was found during the examination.  If the procedure report does not answer your questions, please call your gastroenterologist to clarify.  If you requested that your care partner not be given the details of your procedure findings, then the procedure report has been included in a sealed envelope for you to review at your convenience later.  YOU SHOULD EXPECT: Some feelings of bloating in the abdomen. Passage of more gas than usual.  Walking can help get rid of the air that was put into your GI tract during the procedure and reduce the bloating. If you had a lower endoscopy (such as a colonoscopy or flexible sigmoidoscopy) you may notice spotting of blood in your stool or on the toilet paper. If you underwent a bowel prep for your procedure, you may not have a normal bowel movement for a few days.  Please Note:  You might notice some irritation and congestion in your nose or some drainage.  This is from the oxygen used during your procedure.  There is no need for concern and it should clear up in a day or so.  SYMPTOMS TO REPORT IMMEDIATELY:   Following lower endoscopy (colonoscopy or flexible sigmoidoscopy):  Excessive amounts of blood in the stool  Significant tenderness or worsening of abdominal pains  Swelling of the abdomen that is new, acute  Fever of 100F or higher   For urgent or emergent issues, a gastroenterologist can be reached at any hour by calling [phone].   DIET:  We do recommend a small meal at first, but then you may proceed to your regular diet.  Drink plenty of fluids but you should avoid alcoholic beverages for 24 hours.  ACTIVITY:  You should plan to take it easy for the rest of today and you should NOT DRIVE or use heavy machinery until tomorrow (because of the sedation medicines used during the test).     FOLLOW UP: Our staff will call the number listed on your records the next business day following your procedure to check on you and address any questions or concerns that you may have regarding the information given to you following your procedure. If we do not reach you, we will leave a message.  However, if you are feeling well and you are not experiencing any    problems, there is no need to return our call.  We will assume that you have returned to your regular daily activities without incident.  If any biopsies were taken you will be contacted by phone or by letter within the next 1-3 weeks.  Please call us at [phone] if you have not heard about the biopsies in 3 weeks.    SIGNATURES/CONFIDENTIALITY: You and/or your care partner have signed paperwork which will be entered into your electronic medical record.  These signatures attest to the fact that that the information above on your After Visit Summary has been reviewed and is understood.  Full responsibility of the confidentiality of this discharge information lies with you and/or your care-partner.    No Ibuprofen,naproxen,or other non steroidal anti inflammatory drugs for 2 weeks after polyp removal  Information on polyps and hemorrhoids given to you today  Await pathology results on polyps removed in a letter from Dr Silverio Decamp

## 2017-06-03 NOTE — Progress Notes (Signed)
Pt's states no medical or surgical changes since previsit or office visit. 

## 2017-06-04 ENCOUNTER — Telehealth: Payer: Self-pay

## 2017-06-04 NOTE — Telephone Encounter (Signed)
Left message on answering machine. 

## 2017-06-08 ENCOUNTER — Encounter: Payer: Self-pay | Admitting: Gastroenterology

## 2017-07-16 DIAGNOSIS — F422 Mixed obsessional thoughts and acts: Secondary | ICD-10-CM | POA: Diagnosis not present

## 2017-08-26 DIAGNOSIS — R972 Elevated prostate specific antigen [PSA]: Secondary | ICD-10-CM | POA: Diagnosis not present

## 2017-09-02 DIAGNOSIS — R972 Elevated prostate specific antigen [PSA]: Secondary | ICD-10-CM | POA: Diagnosis not present

## 2017-09-02 DIAGNOSIS — N5201 Erectile dysfunction due to arterial insufficiency: Secondary | ICD-10-CM | POA: Diagnosis not present

## 2017-09-02 DIAGNOSIS — N401 Enlarged prostate with lower urinary tract symptoms: Secondary | ICD-10-CM | POA: Diagnosis not present

## 2017-09-02 DIAGNOSIS — R3915 Urgency of urination: Secondary | ICD-10-CM | POA: Diagnosis not present

## 2018-01-21 DIAGNOSIS — F422 Mixed obsessional thoughts and acts: Secondary | ICD-10-CM | POA: Diagnosis not present

## 2018-01-28 ENCOUNTER — Ambulatory Visit: Payer: BLUE CROSS/BLUE SHIELD | Admitting: Urgent Care

## 2018-01-28 ENCOUNTER — Other Ambulatory Visit: Payer: Self-pay

## 2018-01-28 ENCOUNTER — Encounter: Payer: Self-pay | Admitting: Urgent Care

## 2018-01-28 VITALS — BP 157/93 | HR 90 | Temp 98.7°F | Resp 16 | Ht 74.0 in | Wt 239.0 lb

## 2018-01-28 DIAGNOSIS — R030 Elevated blood-pressure reading, without diagnosis of hypertension: Secondary | ICD-10-CM

## 2018-01-28 DIAGNOSIS — Z113 Encounter for screening for infections with a predominantly sexual mode of transmission: Secondary | ICD-10-CM | POA: Diagnosis not present

## 2018-01-28 DIAGNOSIS — L918 Other hypertrophic disorders of the skin: Secondary | ICD-10-CM

## 2018-01-28 DIAGNOSIS — I10 Essential (primary) hypertension: Secondary | ICD-10-CM | POA: Diagnosis not present

## 2018-01-28 NOTE — Patient Instructions (Addendum)
Safe Sex Practicing safe sex means taking steps before and during sex to reduce your risk of:  Getting an STD (sexually transmitted disease).  Giving your partner an STD.  Unwanted pregnancy.  How can I practice safe sex?  To practice safe sex:  Limit your sexual partners to only one partner who is having sex with only you.  Avoid using alcohol and recreational drugs before having sex. These substances can affect your judgment.  Before having sex with a new partner: ? Talk to your partner about past partners, past STDs, and drug use. ? You and your partner should be screened for STDs and discuss the results with each other.  Check your body regularly for sores, blisters, rashes, or unusual discharge. If you notice any of these problems, visit your health care provider.  If you have symptoms of an infection or you are being treated for an STD, avoid sexual contact.  While having sex, use a condom. Make sure to: ? Use a condom every time you have vaginal, oral, or anal sex. Both females and males should wear condoms during oral sex. ? Keep condoms in place from the beginning to the end of sexual activity. ? Use a latex condom, if possible. Latex condoms offer the best protection. ? Use only water-based lubricants or oils to lubricate a condom. Using petroleum-based lubricants or oils will weaken the condom and increase the chance that it will break.  See your health care provider for regular screenings, exams, and tests for STDs.  Talk with your health care provider about the form of birth control (contraception) that is best for you.  Get vaccinated against hepatitis B and human papillomavirus (HPV).  If you are at risk of being infected with HIV (human immunodeficiency virus), talk with your health care provider about taking a prescription medicine to prevent HIV infection. You are considered at risk for HIV if: ? You are a man who has sex with other men. ? You are a  heterosexual man or woman who is sexually active with more than one partner. ? You take drugs by injection. ? You are sexually active with a partner who has HIV.  This information is not intended to replace advice given to you by your health care provider. Make sure you discuss any questions you have with your health care provider. Document Released: 01/10/2005 Document Revised: 04/18/2016 Document Reviewed: 10/23/2015 Elsevier Interactive Patient Education  2018 [employer].    Hypertension Hypertension, commonly called high blood pressure, is when the force of blood pumping through the arteries is too strong. The arteries are the blood vessels that carry blood from the heart throughout the body. Hypertension forces the heart to work harder to pump blood and may cause arteries to become narrow or stiff. Having untreated or uncontrolled hypertension can cause heart attacks, strokes, kidney disease, and other problems. A blood pressure reading consists of a higher number over a lower number. Ideally, your blood pressure should be below 120/80. The first ("top") number is called the systolic pressure. It is a measure of the pressure in your arteries as your heart beats. The second ("bottom") number is called the diastolic pressure. It is a measure of the pressure in your arteries as the heart relaxes. What are the causes? The cause of this condition is not known. What increases the risk? Some risk factors for high blood pressure are under your control. Others are not. Factors you can change  Smoking.  Having type 2 diabetes mellitus,  high cholesterol, or both.  Not getting enough exercise or physical activity.  Being overweight.  Having too much fat, sugar, calories, or salt (sodium) in your diet.  Drinking too much alcohol. Factors that are difficult or impossible to change  Having chronic kidney disease.  Having a family history of high blood pressure.  Age. Risk increases with  age.  Race. You may be at higher risk if you are African-American.  Gender. Men are at higher risk than women before age 20. After age 47, women are at higher risk than men.  Having obstructive sleep apnea.  Stress. What are the signs or symptoms? Extremely high blood pressure (hypertensive crisis) may cause:  Headache.  Anxiety.  Shortness of breath.  Nosebleed.  Nausea and vomiting.  Severe chest pain.  Jerky movements you cannot control (seizures).  How is this diagnosed? This condition is diagnosed by measuring your blood pressure while you are seated, with your arm resting on a surface. The cuff of the blood pressure monitor will be placed directly against the skin of your upper arm at the level of your heart. It should be measured at least twice using the same arm. Certain conditions can cause a difference in blood pressure between your right and left arms. Certain factors can cause blood pressure readings to be lower or higher than normal (elevated) for a short period of time:  When your blood pressure is higher when you are in a health care provider's office than when you are at home, this is called white coat hypertension. Most people with this condition do not need medicines.  When your blood pressure is higher at home than when you are in a health care provider's office, this is called masked hypertension. Most people with this condition may need medicines to control blood pressure.  If you have a high blood pressure reading during one visit or you have normal blood pressure with other risk factors:  You may be asked to return on a different day to have your blood pressure checked again.  You may be asked to monitor your blood pressure at home for 1 week or longer.  If you are diagnosed with hypertension, you may have other blood or imaging tests to help your health care provider understand your overall risk for other conditions. How is this treated? This  condition is treated by making healthy lifestyle changes, such as eating healthy foods, exercising more, and reducing your alcohol intake. Your health care provider may prescribe medicine if lifestyle changes are not enough to get your blood pressure under control, and if:  Your systolic blood pressure is above 130.  Your diastolic blood pressure is above 80.  Your personal target blood pressure may vary depending on your medical conditions, your age, and other factors. Follow these instructions at home: Eating and drinking  Eat a diet that is high in fiber and potassium, and low in sodium, added sugar, and fat. An example eating plan is called the DASH (Dietary Approaches to Stop Hypertension) diet. To eat this way: ? Eat plenty of fresh fruits and vegetables. Try to fill half of your plate at each meal with fruits and vegetables. ? Eat whole grains, such as whole wheat pasta, brown rice, or whole grain bread. Fill about one quarter of your plate with whole grains. ? Eat or drink low-fat dairy products, such as skim milk or low-fat yogurt. ? Avoid fatty cuts of meat, processed or cured meats, and poultry with skin. Fill about  one quarter of your plate with lean proteins, such as fish, chicken without skin, beans, eggs, and tofu. ? Avoid premade and processed foods. These tend to be higher in sodium, added sugar, and fat.  Reduce your daily sodium intake. Most people with hypertension should eat less than 1,500 mg of sodium a day.  Limit alcohol intake to no more than 1 drink a day for nonpregnant women and 2 drinks a day for men. One drink equals 12 oz of beer, 5 oz of wine, or 1 oz of hard liquor. Lifestyle  Work with your health care provider to maintain a healthy body weight or to lose weight. Ask what an ideal weight is for you.  Get at least 30 minutes of exercise that causes your heart to beat faster (aerobic exercise) most days of the week. Activities may include walking, swimming,  or biking.  Include exercise to strengthen your muscles (resistance exercise), such as pilates or lifting weights, as part of your weekly exercise routine. Try to do these types of exercises for 30 minutes at least 3 days a week.  Do not use any products that contain nicotine or tobacco, such as cigarettes and e-cigarettes. If you need help quitting, ask your health care provider.  Monitor your blood pressure at home as told by your health care provider.  Keep all follow-up visits as told by your health care provider. This is important. Medicines  Take over-the-counter and prescription medicines only as told by your health care provider. Follow directions carefully. Blood pressure medicines must be taken as prescribed.  Do not skip doses of blood pressure medicine. Doing this puts you at risk for problems and can make the medicine less effective.  Ask your health care provider about side effects or reactions to medicines that you should watch for. Contact a health care provider if:  You think you are having a reaction to a medicine you are taking.  You have headaches that keep coming back (recurring).  You feel dizzy.  You have swelling in your ankles.  You have trouble with your vision. Get help right away if:  You develop a severe headache or confusion.  You have unusual weakness or numbness.  You feel faint.  You have severe pain in your chest or abdomen.  You vomit repeatedly.  You have trouble breathing. Summary  Hypertension is when the force of blood pumping through your arteries is too strong. If this condition is not controlled, it may put you at risk for serious complications.  Your personal target blood pressure may vary depending on your medical conditions, your age, and other factors. For most people, a normal blood pressure is less than 120/80.  Hypertension is treated with lifestyle changes, medicines, or a combination of both. Lifestyle changes include  weight loss, eating a healthy, low-sodium diet, exercising more, and limiting alcohol. This information is not intended to replace advice given to you by your health care provider. Make sure you discuss any questions you have with your health care provider. Document Released: 12/03/2005 Document Revised: 10/31/2016 Document Reviewed: 10/31/2016 Elsevier Interactive Patient Education  2018 [place], Adult A skin tag (acrochordon) is a soft, extra growth of skin. Most skin tags are flesh-colored and rarely bigger than a pencil eraser. They commonly form near areas where there are folds in the skin, such as the armpit or groin. Skin tags are not dangerous, and they do not spread from person to person (are not  contagious). You may have one skin tag or several. Skin tags do not require treatment. However, your health care provider may recommend removal of a skin tag if it:  Gets irritated from clothing.  Bleeds.  Is visible and unsightly.  Your health care provider can remove skin tags with a simple surgical procedure or a procedure that involves freezing the skin tag. Follow these instructions at home:  Watch for any changes in your skin tag. A normal skin tag does not require any other special care at home.  Take over-the-counter and prescription medicines only as told by your health care provider.  Keep all follow-up visits as told by your health care provider. This is important. Contact a health care provider if:  You have a skin tag that: ? Becomes painful. ? Changes color. ? Bleeds. ? Swells.  You develop more skin tags. This information is not intended to replace advice given to you by your health care provider. Make sure you discuss any questions you have with your health care provider. Document Released: 12/18/2015 Document Revised: 07/29/2016 Document Reviewed: 12/18/2015 Elsevier Interactive Patient Education  [employer].

## 2018-01-28 NOTE — Progress Notes (Signed)
  MRN: [id number] DOB: [birth year]  Subjective:   Scott Schaefer is a 57 y.o. male presenting for had some mild discomfort while urinating after having sex with his long term girlfriend of 8 years. Does not use condoms. Admits that it could be menopausal changes she's going through, had difficulty with dryness the last time they had sex. Denies hematuria, urinary frequency, penile discharge, penile swelling, testicular pain, testicular swelling, anal pain, groin pain. Takes HCTZ, amlodipine for HTN. Denies dizziness, chronic headache, chest pain, shortness of breath, heart racing, palpitations, nausea, vomiting, abdominal pain, hematuria, lower leg swelling. Smokes 1/2-3/4ppd.   Scott Schaefer has a current medication list which includes the following prescription(s): alprazolam, amlodipine, diphenhydramine, famotidine, hydrochlorothiazide, metformin, one touch ultra test, and paroxetine, and the following Facility-Administered Medications: sodium chloride. Also is allergic to lisinopril and ace inhibitors.  Scott Schaefer  has a past medical history of Anxiety, Diabetes mellitus, Hypertension, and Sleep apnea. Also  has a past surgical history that includes no prior surgeries and Colonoscopy.  Objective:   Vitals: BP (!) 172/84   Pulse 90   Temp 98.7 F (37.1 C) (Oral)   Resp 16   Ht 6\' 2"  (1.88 m)   Wt 239 lb (108.4 kg)   SpO2 98%   BMI 30.69 kg/m   BP Readings from Last 3 Encounters:  01/28/18 (!) 172/84  06/03/17 110/74  10/11/16 (!) 160/88   Physical Exam  Constitutional: He is oriented to person, place, and time. He appears well-developed and well-nourished.  HENT:  Head:    Mouth/Throat: Oropharynx is clear and moist.  Eyes: No scleral icterus.  Cardiovascular: Normal rate, regular rhythm and intact distal pulses. Exam reveals no gallop and no friction rub.  No murmur heard. Pulmonary/Chest: Effort normal. No respiratory distress. He has no wheezes. He has no rales.  Neurological: He is  alert and oriented to person, place, and time.  Skin: Skin is warm and dry.  Psychiatric: He has a normal mood and affect.   Assessment and Plan :   Routine screening for STI (sexually transmitted infection) - Plan: GC/Chlamydia Probe Amp([facility]), Trichomonas vaginalis, RNA, RPR, HIV antibody  Essential hypertension  Elevated blood pressure reading  Skin tag  Labs pending, will notify of positive results from STI. Continue to practice safe sex. Patient is asymptomatic regarding BP today. ER and return-to-clinic precautions discussed, patient verbalized understanding. Offered to remove skin tag for patient if he would like to have this done in the future. He will consider this.  Jaynee Eagles, PA-C [facility] [phone] 01/28/2018  8:39 AM

## 2018-01-29 LAB — RPR: RPR: NONREACTIVE

## 2018-01-29 LAB — HIV ANTIBODY (ROUTINE TESTING W REFLEX): HIV Screen 4th Generation wRfx: NONREACTIVE

## 2018-01-29 LAB — GC/CHLAMYDIA PROBE AMP
Chlamydia trachomatis, NAA: NEGATIVE
Neisseria gonorrhoeae by PCR: NEGATIVE

## 2018-01-30 LAB — TRICHOMONAS VAGINALIS, PROBE AMP: Trich vag by NAA: NEGATIVE

## 2018-05-21 ENCOUNTER — Other Ambulatory Visit: Payer: Self-pay | Admitting: Family Medicine

## 2018-05-21 DIAGNOSIS — M545 Low back pain: Secondary | ICD-10-CM

## 2018-07-11 DIAGNOSIS — F422 Mixed obsessional thoughts and acts: Secondary | ICD-10-CM | POA: Diagnosis not present

## 2018-12-24 DIAGNOSIS — F410 Panic disorder [episodic paroxysmal anxiety] without agoraphobia: Secondary | ICD-10-CM | POA: Diagnosis not present

## 2018-12-24 DIAGNOSIS — F422 Mixed obsessional thoughts and acts: Secondary | ICD-10-CM | POA: Diagnosis not present

## 2019-01-11 DIAGNOSIS — Z20828 Contact with and (suspected) exposure to other viral communicable diseases: Secondary | ICD-10-CM | POA: Diagnosis not present

## 2019-06-19 DIAGNOSIS — F422 Mixed obsessional thoughts and acts: Secondary | ICD-10-CM | POA: Diagnosis not present

## 2019-09-14 DIAGNOSIS — Z20828 Contact with and (suspected) exposure to other viral communicable diseases: Secondary | ICD-10-CM | POA: Diagnosis not present

## 2019-09-24 ENCOUNTER — Other Ambulatory Visit: Payer: Self-pay

## 2019-09-24 DIAGNOSIS — Z20822 Contact with and (suspected) exposure to covid-19: Secondary | ICD-10-CM

## 2019-09-24 DIAGNOSIS — Z20828 Contact with and (suspected) exposure to other viral communicable diseases: Secondary | ICD-10-CM | POA: Diagnosis not present

## 2019-09-25 LAB — NOVEL CORONAVIRUS, NAA: SARS-CoV-2, NAA: NOT DETECTED

## 2019-09-30 ENCOUNTER — Other Ambulatory Visit: Payer: Self-pay

## 2019-09-30 DIAGNOSIS — Z20828 Contact with and (suspected) exposure to other viral communicable diseases: Secondary | ICD-10-CM | POA: Diagnosis not present

## 2019-09-30 DIAGNOSIS — Z20822 Contact with and (suspected) exposure to covid-19: Secondary | ICD-10-CM

## 2019-10-01 LAB — NOVEL CORONAVIRUS, NAA: SARS-CoV-2, NAA: NOT DETECTED

## 2019-11-09 ENCOUNTER — Other Ambulatory Visit: Payer: Self-pay

## 2019-11-09 DIAGNOSIS — Z20822 Contact with and (suspected) exposure to covid-19: Secondary | ICD-10-CM

## 2019-11-10 LAB — NOVEL CORONAVIRUS, NAA: SARS-CoV-2, NAA: NOT DETECTED

## 2019-12-07 DIAGNOSIS — Z20828 Contact with and (suspected) exposure to other viral communicable diseases: Secondary | ICD-10-CM | POA: Diagnosis not present

## 2020-01-08 DIAGNOSIS — Z20828 Contact with and (suspected) exposure to other viral communicable diseases: Secondary | ICD-10-CM | POA: Diagnosis not present

## 2020-02-25 ENCOUNTER — Ambulatory Visit: Payer: BC Managed Care – PPO

## 2020-03-11 ENCOUNTER — Ambulatory Visit: Payer: BC Managed Care – PPO | Attending: Internal Medicine

## 2020-03-11 DIAGNOSIS — Z23 Encounter for immunization: Secondary | ICD-10-CM

## 2020-03-11 NOTE — Progress Notes (Signed)
   Covid-19 Vaccination Clinic  Name:  Scott Schaefer    MRN: [id number] DOB: Oct 19, 1961  03/11/2020  Mr. Scott Schaefer was observed post Covid-19 immunization for 30 minutes based on pre-vaccination screening without incident. He was provided with Vaccine Information Sheet and instruction to access the V-Safe system.   Mr. Scott Schaefer was instructed to call 911 with any severe reactions post vaccine: Marland Kitchen Difficulty breathing  . Swelling of face and throat  . A fast heartbeat  . A bad rash all over body  . Dizziness and weakness   Immunizations Administered    Name Date Dose VIS Date Route   Pfizer COVID-19 Vaccine 03/11/2020 10:58 AM 0.3 mL 11/27/2019 Intramuscular   Manufacturer: [place]   Lot: [id number]   [place]: [id number]

## 2020-04-04 ENCOUNTER — Ambulatory Visit: Payer: Managed Care, Other (non HMO) | Attending: Internal Medicine

## 2020-04-04 DIAGNOSIS — Z23 Encounter for immunization: Secondary | ICD-10-CM

## 2020-04-04 NOTE — Progress Notes (Signed)
   Covid-19 Vaccination Clinic  Name:  Scott Schaefer    MRN: [id number] DOB: 1961/07/01  04/04/2020  [name] was observed post Covid-19 immunization for 30 minutes based on pre-vaccination screening without incident. He was provided with Vaccine Information Sheet and instruction to access the V-Safe system.   [name] was instructed to call 911 with any severe reactions post vaccine: Marland Kitchen Difficulty breathing  . Swelling of face and throat  . A fast heartbeat  . A bad rash all over body  . Dizziness and weakness   Immunizations Administered    Name Date Dose VIS Date Route   Pfizer COVID-19 Vaccine 04/04/2020  4:00 PM 0.3 mL 02/10/2019 Intramuscular   Manufacturer: [place]   Lot: [id number]   [place]: [id number]

## 2021-12-17 HISTORY — PX: COLONOSCOPY: SHX174

## 2022-06-11 ENCOUNTER — Encounter: Payer: Self-pay | Admitting: Gastroenterology

## 2022-07-18 ENCOUNTER — Ambulatory Visit (AMBULATORY_SURGERY_CENTER): Payer: Self-pay

## 2022-07-18 VITALS — Ht 74.0 in | Wt 236.0 lb

## 2022-07-18 DIAGNOSIS — Z86010 Personal history of colonic polyps: Secondary | ICD-10-CM

## 2022-07-18 MED ORDER — NA SULFATE-K SULFATE-MG SULF 17.5-3.13-1.6 GM/177ML PO SOLN: 1.0000 | ORAL | 0 refills | Status: DC

## 2022-07-18 NOTE — Progress Notes (Signed)
No egg or soy allergy known to patient  No issues known to pt with past sedation with any surgeries or procedures Patient denies ever being told they had issues or difficulty with intubation  No FH of Malignant Hyperthermia Pt is not on diet pills Pt is not on  home 02  Pt is not on blood thinners  Pt denies issues with constipation  No A fib or A flutter Have any cardiac testing pending--denied Pt instructed to use [email or web address] or GoodRx for a price reduction on prep   

## 2022-08-03 ENCOUNTER — Encounter: Payer: Self-pay | Admitting: Gastroenterology

## 2022-08-13 ENCOUNTER — Encounter: Payer: Managed Care, Other (non HMO) | Admitting: Gastroenterology

## 2022-10-03 ENCOUNTER — Ambulatory Visit (AMBULATORY_SURGERY_CENTER): Payer: 59 | Admitting: Gastroenterology

## 2022-10-03 ENCOUNTER — Encounter: Payer: Self-pay | Admitting: Gastroenterology

## 2022-10-03 VITALS — BP 121/80 | HR 70 | Temp 99.3°F | Resp 17 | Ht 74.0 in | Wt 236.0 lb

## 2022-10-03 DIAGNOSIS — Z86010 Personal history of colonic polyps: Secondary | ICD-10-CM | POA: Diagnosis not present

## 2022-10-03 DIAGNOSIS — Z09 Encounter for follow-up examination after completed treatment for conditions other than malignant neoplasm: Secondary | ICD-10-CM

## 2022-10-03 DIAGNOSIS — D125 Benign neoplasm of sigmoid colon: Secondary | ICD-10-CM

## 2022-10-03 DIAGNOSIS — D123 Benign neoplasm of transverse colon: Secondary | ICD-10-CM

## 2022-10-03 MED ORDER — SODIUM CHLORIDE 0.9 % IV SOLN: 500.0000 mL | Freq: Once | INTRAVENOUS | Status: DC

## 2022-10-03 NOTE — Op Note (Signed)
[facility] Patient Name: Scott Schaefer Procedure Date: 10/03/2022 8:35 AM MRN: [id number] Endoscopist: Remo Lipps P. Havery Moros , MD Age: 61 Referring MD:  Date of Birth: [birth year] Gender: Male Account #: [id number] Procedure:                Colonoscopy Indications:              High risk colon cancer surveillance: Personal                            history of colonic polyps - adenomas removed in                            2013 and 2018 Medicines:                Monitored Anesthesia Care Procedure:                Pre-Anesthesia Assessment:                           - Prior to the procedure, a History and Physical                            was performed, and patient medications and                            allergies were reviewed. The patient's tolerance of                            previous anesthesia was also reviewed. The risks                            and benefits of the procedure and the sedation                            options and risks were discussed with the patient.                            All questions were answered, and informed consent                            was obtained. Prior Anticoagulants: The patient has                            taken no previous anticoagulant or antiplatelet                            agents. ASA Grade Assessment: III - A patient with                            severe systemic disease. After reviewing the risks                            and benefits, the patient was deemed in  satisfactory condition to undergo the procedure.                           After obtaining informed consent, the colonoscope                            was passed under direct vision. Throughout the                            procedure, the patient's blood pressure, pulse, and                            oxygen saturations were monitored continuously. The                            CF [id number] #[id number] was introduced through  the anus                            and advanced to the the cecum, identified by                            appendiceal orifice and ileocecal valve. The                            colonoscopy was performed without difficulty. The                            patient tolerated the procedure well. The quality                            of the bowel preparation was adequate. The                            ileocecal valve, appendiceal orifice, and rectum                            were photographed. Scope In: 8:40:42 AM Scope Out: 9:08:50 AM Scope Withdrawal Time: 0 hours 18 minutes 14 seconds  Total Procedure Duration: 0 hours 28 minutes 8 seconds  Findings:                 The perianal and digital rectal examinations were                            normal.                           The colon was tortuous.                           A large amount of liquid stool was found in the                            entire colon, making visualization difficult.  Lavage of the colon was performed using copious                            amounts of sterile water, resulting in clearance                            with adequate visualization. This process prolonged                            the exam                           Two sessile polyps were found in the transverse                            colon. The polyps were 3 to 4 mm in size. These                            polyps were removed with a cold snare. Resection                            and retrieval were complete.                           A 3 mm polyp was found in the splenic flexure. The                            polyp was sessile. The polyp was removed with a                            cold snare. Resection and retrieval were complete.                           A 3 mm polyp was found in the sigmoid colon. The                            polyp was sessile. The polyp was removed with a                            cold  snare. Resection and retrieval were complete.                           Anal papilla(e) were hypertrophied.                           Internal hemorrhoids were found during retroflexion.                           The exam was otherwise without abnormality. Complications:            No immediate complications. Estimated blood loss:                            Minimal. Estimated Blood Loss:  Estimated blood loss was minimal. Impression:               - Tortuous colon.                           - Stool in the entire examined colon requiring                            extensive lavage                           - Two 3 to 4 mm polyps in the transverse colon,                            removed with a cold snare. Resected and retrieved.                           - One 3 mm polyp at the splenic flexure, removed                            with a cold snare. Resected and retrieved.                           - One 3 mm polyp in the sigmoid colon, removed with                            a cold snare. Resected and retrieved.                           - Anal papilla(e) were hypertrophied.                           - Internal hemorrhoids.                           - The examination was otherwise normal. Recommendation:           - Patient has a contact number available for                            emergencies. The signs and symptoms of potential                            delayed complications were discussed with the                            patient. Return to normal activities tomorrow.                            Written discharge instructions were provided to the                            patient.                           - Resume previous  diet.                           - Continue present medications.                           - Await pathology results. Remo Lipps P. Harrol Novello, MD 10/03/2022 9:15:37 AM This report has been signed electronically.

## 2022-10-03 NOTE — Patient Instructions (Signed)
Information on polyps given to you today.  Await pathology results.  Resume previous diet and medications.    YOU HAD AN ENDOSCOPIC PROCEDURE TODAY AT THE [facility]:   Refer to the procedure report that was given to you for any specific questions about what was found during the examination.  If the procedure report does not answer your questions, please call your gastroenterologist to clarify.  If you requested that your care partner not be given the details of your procedure findings, then the procedure report has been included in a sealed envelope for you to review at your convenience later.  YOU SHOULD EXPECT: Some feelings of bloating in the abdomen. Passage of more gas than usual.  Walking can help get rid of the air that was put into your GI tract during the procedure and reduce the bloating. If you had a lower endoscopy (such as a colonoscopy or flexible sigmoidoscopy) you may notice spotting of blood in your stool or on the toilet paper. If you underwent a bowel prep for your procedure, you may not have a normal bowel movement for a few days.  Please Note:  You might notice some irritation and congestion in your nose or some drainage.  This is from the oxygen used during your procedure.  There is no need for concern and it should clear up in a day or so.  SYMPTOMS TO REPORT IMMEDIATELY:  Following lower endoscopy (colonoscopy or flexible sigmoidoscopy):  Excessive amounts of blood in the stool  Significant tenderness or worsening of abdominal pains  Swelling of the abdomen that is new, acute  Fever of 100F or higher  For urgent or emergent issues, a gastroenterologist can be reached at any hour by calling [phone]. Do not use MyChart messaging for urgent concerns.    DIET:  We do recommend a small meal at first, but then you may proceed to your regular diet.  Drink plenty of fluids but you should avoid alcoholic beverages for 24 hours.  ACTIVITY:  You should  plan to take it easy for the rest of today and you should NOT DRIVE or use heavy machinery until tomorrow (because of the sedation medicines used during the test).    FOLLOW UP: Our staff will call the number listed on your records the next business day following your procedure.  We will call around 7:15- 8:00 am to check on you and address any questions or concerns that you may have regarding the information given to you following your procedure. If we do not reach you, we will leave a message.     If any biopsies were taken you will be contacted by phone or by letter within the next 1-3 weeks.  Please call us at [phone] if you have not heard about the biopsies in 3 weeks.    SIGNATURES/CONFIDENTIALITY: You and/or your care partner have signed paperwork which will be entered into your electronic medical record.  These signatures attest to the fact that that the information above on your After Visit Summary has been reviewed and is understood.  Full responsibility of the confidentiality of this discharge information lies with you and/or your care-partner.  

## 2022-10-03 NOTE — Progress Notes (Signed)
Pt's states no medical or surgical changes since previsit or office visit. 

## 2022-10-03 NOTE — Progress Notes (Signed)
[facility] History and Physical   Primary Care Physician:  Sabra Heck, [place], [place]   Reason for Procedure:   History of colon polyps  Plan:    colonoscopy     HPI: Scott Schaefer is a 61 y.o. male  here for colonoscopy surveillance - adenomas removed in 2013 and 2018. Patient denies any bowel symptoms at this time. No family history of colon cancer known. Otherwise feels well without any cardiopulmonary symptoms.   I have discussed risks / benefits of anesthesia and endoscopic procedure with [facility] and they wish to proceed with the exams as outlined today.    Past Medical History:  Diagnosis Date   Anxiety    Arthritis    Depression    Diabetes mellitus    GERD (gastroesophageal reflux disease)    Hyperlipidemia    Hypertension    Sleep apnea    has cpap but does not use    Past Surgical History:  Procedure Laterality Date   COLONOSCOPY      Prior to Admission medications   Medication Sig Start Date End Date Taking? Authorizing Provider  Accu-Chek FastClix Lancets MISC Apply topically. 07/17/22  Yes [provider]  ALPRAZolam Duanne Moron) 1 MG tablet Take 1 mg by mouth 3 (three) times daily as needed for anxiety.  02/03/12  Yes [provider]  amLODipine (NORVASC) 10 MG tablet daily. 07/31/16  Yes [provider]  glipiZIDE (GLUCOTROL XL) 2.5 MG 24 hr tablet Take by mouth. 07/17/22  Yes [provider]  hydrochlorothiazide (HYDRODIURIL) 25 MG tablet Take 1 tablet (25 mg total) by mouth daily. 10/17/15  Yes Rai, Ripudeep K, MD  metFORMIN (GLUCOPHAGE) 1000 MG tablet Take 1 tablet by mouth 2 (two) times daily.   Yes [provider]  metoprolol succinate (TOPROL-XL) 25 MG 24 hr tablet Take 25 mg by mouth daily. 06/14/22  Yes [provider]  ONE TOUCH ULTRA TEST test strip  09/12/16  Yes [provider]  PARoxetine (PAXIL) 20 MG tablet Take 20 mg by mouth every morning.  01/29/12  Yes [provider]  diphenhydrAMINE (BENADRYL) 25 MG tablet Take 1 tablet (25 mg total) by mouth every 6 (six) hours as needed. 05/27/16   Kirichenko, Tatyana, PA-C  famotidine (PEPCID) 20 MG tablet Take 1 tablet (20 mg total) by mouth 2 (two) times daily. While on steroid pack 10/17/15   Rai, Ripudeep K, MD  gabapentin (NEURONTIN) 300 MG capsule TAKE 1 CAPSULE BY MOUTH EVERYDAY AT BEDTIME    [provider]  sildenafil (VIAGRA) 100 MG tablet 1 TABLET AS NEEDED ORALLY ONCE A DAY 30 DAY(S)    [provider]  TRULICITY 1.5 EN/2.7PO SOPN Inject into the skin. 07/18/22   [provider]    Current Outpatient Medications  Medication Sig Dispense Refill   Accu-Chek FastClix Lancets MISC Apply topically.     ALPRAZolam (XANAX) 1 MG tablet Take 1 mg by mouth 3 (three) times daily as needed for anxiety.      amLODipine (NORVASC) 10 MG tablet daily.     glipiZIDE (GLUCOTROL XL) 2.5 MG 24 hr tablet Take by mouth.     hydrochlorothiazide (HYDRODIURIL) 25 MG tablet Take 1 tablet (25 mg total) by mouth daily. 30 tablet 3   metFORMIN (GLUCOPHAGE) 1000 MG tablet Take 1 tablet by mouth 2 (two) times daily.     metoprolol succinate (TOPROL-XL) 25 MG 24 hr tablet Take 25 mg by mouth daily.     ONE  TOUCH ULTRA TEST test strip      PARoxetine (PAXIL) 20 MG tablet Take 20 mg by mouth every morning.      diphenhydrAMINE (BENADRYL) 25 MG tablet Take 1 tablet (25 mg total) by mouth every 6 (six) hours as needed. 30 tablet 0   famotidine (PEPCID) 20 MG tablet Take 1 tablet (20 mg total) by mouth 2 (two) times daily. While on steroid pack 30 tablet 0   gabapentin (NEURONTIN) 300 MG capsule TAKE 1 CAPSULE BY MOUTH EVERYDAY AT BEDTIME     sildenafil (VIAGRA) 100 MG tablet 1 TABLET AS NEEDED ORALLY ONCE A DAY 30 DAY(S)     TRULICITY 1.5 ST/4.1DQ SOPN Inject into the skin.     Current Facility-Administered Medications  Medication Dose Route Frequency Provider Last Rate Last Admin   0.9 %  sodium  chloride infusion  500 mL Intravenous Continuous Seymore Brodowski, Carlota Raspberry, MD       0.9 %  sodium chloride infusion  500 mL Intravenous Once Iverson Sees, Carlota Raspberry, MD        Allergies as of 10/03/2022 - Review Complete 10/03/2022  Allergen Reaction Noted   Lisinopril Anaphylaxis 04/26/2016   Ace inhibitors Other (See Comments) 10/17/2015    Family History  Problem Relation Age of Onset   Colon polyps Sister    Hypertension Sister    Hypertension Brother    Diabetes Maternal Grandmother    Colon cancer Neg Hx    Stomach cancer Neg Hx    Esophageal cancer Neg Hx    Rectal cancer Neg Hx     Social History   Socioeconomic History   Marital status: Single    Spouse name: Not on file   Number of children: Not on file   Years of education: Not on file   Highest education level: Not on file  Occupational History   Not on file  Tobacco Use   Smoking status: Every Day    Packs/day: 0.80    Types: Cigarettes   Smokeless tobacco: Never  Substance and Sexual Activity   Alcohol use: Yes    Alcohol/week: 3.0 - 4.0 standard drinks of alcohol    Types: 3 - 4 Cans of beer per week    Comment: weekends only   Drug use: No   Sexual activity: Yes  Other Topics Concern   Not on file  Social History Narrative   Not on file   Social Determinants of Health   Financial Resource Strain: Not on file  Food Insecurity: Not on file  Transportation Needs: Not on file  Physical Activity: Not on file  Stress: Not on file  Social Connections: Not on file  Intimate Partner Violence: Not on file    Review of Systems: All other review of systems negative except as mentioned in the HPI.  Physical Exam: Vital signs BP (!) 140/79   Pulse 80   Temp 99.3 F (37.4 C)   Ht '6\' 2"'$  (1.88 m)   Wt 236 lb (107 kg)   SpO2 100%   BMI 30.30 kg/m   General:   Alert,  Well-developed, pleasant and cooperative in NAD Lungs:  Clear throughout to auscultation.   Heart:  Regular rate and rhythm Abdomen:   Soft, nontender and nondistended.   Neuro/Psych:  Alert and cooperative. Normal mood and affect. A and O x 3  Jolly Mango, MD [facility]

## 2022-10-03 NOTE — Progress Notes (Signed)
VSS, transported to PACU °

## 2022-10-03 NOTE — Progress Notes (Signed)
Called to room to assist during endoscopic procedure.  Patient ID and intended procedure confirmed with present staff. Received instructions for my participation in the procedure from the performing physician.  

## 2022-10-04 ENCOUNTER — Telehealth: Payer: Self-pay

## 2022-10-04 NOTE — Telephone Encounter (Signed)
Left message on follow up call. 

## 2023-11-05 ENCOUNTER — Ambulatory Visit (HOSPITAL_COMMUNITY): Payer: Self-pay | Admitting: Orthopedic Surgery

## 2023-11-20 NOTE — Pre-Procedure Instructions (Signed)
Surgical Instructions   Your procedure is scheduled on November 25, 2023. Report to [facility] Main Entrance "A" at 5:30 A.M., then check in with the Admitting office. Any questions or running late day of surgery: call [phone]  Questions prior to your surgery date: call [phone], Monday-Friday, 8am-4pm. If you experience any cold or flu symptoms such as cough, fever, chills, shortness of breath, etc. between now and your scheduled surgery, please notify us at the above number.     Remember:  Do not eat after midnight the night before your surgery  You may drink clear liquids until 4:30 AM the morning of your surgery.   Clear liquids allowed are: Water, Non-Citrus Juices (without pulp), Carbonated Beverages, Clear Tea (no milk, honey, etc.), Black Coffee Only (NO MILK, CREAM OR POWDERED CREAMER of any kind), and Gatorade.    Take these medicines the morning of surgery with A SIP OF WATER: amLODipine (NORVASC)  famotidine (PEPCID)  metoprolol succinate (TOPROL-XL)  PARoxetine (PAXIL)    May take these medicines IF NEEDED: ALPRAZolam (XANAX)  diphenhydrAMINE (BENADRYL)    One week prior to surgery, STOP taking any Aspirin (unless otherwise instructed by your surgeon) Aleve, Naproxen, Ibuprofen, Motrin, Advil, Goody's, BC's, all herbal medications, fish oil, and non-prescription vitamins.   WHAT DO I DO ABOUT MY DIABETES MEDICATION?   Do not take glipiZIDE (GLUCOTROL XL) the evening before surgery or the morning of surgery.  Do not take metFORMIN (GLUCOPHAGE) the morning of surgery.  STOP taking TRULICITY one week prior to surgery. DO NOT take any doses after December 1st.       HOW TO MANAGE YOUR DIABETES BEFORE AND AFTER SURGERY  Why is it important to control my blood sugar before and after surgery? Improving blood sugar levels before and after surgery helps healing and can limit problems. A way of improving blood sugar control is eating a healthy diet by:   Eating less sugar and carbohydrates  Increasing activity/exercise  Talking with your doctor about reaching your blood sugar goals High blood sugars (greater than 180 mg/dL) can raise your risk of infections and slow your recovery, so you will need to focus on controlling your diabetes during the weeks before surgery. Make sure that the doctor who takes care of your diabetes knows about your planned surgery including the date and location.  How do I manage my blood sugar before surgery? Check your blood sugar at least 4 times a day, starting 2 days before surgery, to make sure that the level is not too high or low.  Check your blood sugar the morning of your surgery when you wake up and every 2 hours until you get to the Short Stay unit.  If your blood sugar is less than 70 mg/dL, you will need to treat for low blood sugar: Do not take insulin. Treat a low blood sugar (less than 70 mg/dL) with  cup of clear juice (cranberry or apple), 4 glucose tablets, OR glucose gel. Recheck blood sugar in 15 minutes after treatment (to make sure it is greater than 70 mg/dL). If your blood sugar is not greater than 70 mg/dL on recheck, call [phone] for further instructions. Report your blood sugar to the short stay nurse when you get to Short Stay.  If you are admitted to the hospital after surgery: Your blood sugar will be checked by the staff and you will probably be given insulin after surgery (instead of oral diabetes medicines) to make sure you have good blood  sugar levels. The goal for blood sugar control after surgery is 80-180 mg/dL.                      Do NOT Smoke (Tobacco/Vaping) for 24 hours prior to your procedure.  If you use a CPAP at night, you may bring your mask/headgear for your overnight stay.   You will be asked to remove any contacts, glasses, piercing's, hearing aid's, dentures/partials prior to surgery. Please bring cases for these items if needed.    Patients discharged  the day of surgery will not be allowed to drive home, and someone needs to stay with them for 24 hours.  SURGICAL WAITING ROOM VISITATION Patients may have no more than 2 support people in the waiting area - these visitors may rotate.   Pre-op nurse will coordinate an appropriate time for 1 ADULT support person, who may not rotate, to accompany patient in pre-op.  Children under the age of 40 must have an adult with them who is not the patient and must remain in the main waiting area with an adult.  If the patient needs to stay at the hospital during part of their recovery, the visitor guidelines for inpatient rooms apply.  Please refer to the [facility] website for the visitor guidelines for any additional information.   If you received a COVID test during your pre-op visit  it is requested that you wear a mask when out in public, stay away from anyone that may not be feeling well and notify your surgeon if you develop symptoms. If you have been in contact with anyone that has tested positive in the last 10 days please notify you surgeon.      Pre-operative 5 CHG Bathing Instructions   You can play a key role in reducing the risk of infection after surgery. Your skin needs to be as free of germs as possible. You can reduce the number of germs on your skin by washing with CHG (chlorhexidine gluconate) soap before surgery. CHG is an antiseptic soap that kills germs and continues to kill germs even after washing.   DO NOT use if you have an allergy to chlorhexidine/CHG or antibacterial soaps. If your skin becomes reddened or irritated, stop using the CHG and notify one of our RNs at [phone].   Please shower with the CHG soap starting 4 days before surgery using the following schedule:     Please keep in mind the following:  DO NOT shave, including legs and underarms, starting the day of your first shower.   You may shave your face at any point before/day of surgery.  Place clean  sheets on your bed the day you start using CHG soap. Use a clean washcloth (not used since being washed) for each shower. DO NOT sleep with pets once you start using the CHG.   CHG Shower Instructions:  Wash your face and private area with normal soap. If you choose to wash your hair, wash first with your normal shampoo.  After you use shampoo/soap, rinse your hair and body thoroughly to remove shampoo/soap residue.  Turn the water OFF and apply about 3 tablespoons (45 ml) of CHG soap to a CLEAN washcloth.  Apply CHG soap ONLY FROM YOUR NECK DOWN TO YOUR TOES (washing for 3-5 minutes)  DO NOT use CHG soap on face, private areas, open wounds, or sores.  Pay special attention to the area where your surgery is being performed.  If you are  having back surgery, having someone wash your back for you may be helpful. Wait 2 minutes after CHG soap is applied, then you may rinse off the CHG soap.  Pat dry with a clean towel  Put on clean clothes/pajamas   If you choose to wear lotion, please use ONLY the CHG-compatible lotions on the back of this paper.   Additional instructions for the day of surgery: DO NOT APPLY any lotions, deodorants, cologne, or perfumes.   Do not bring valuables to the hospital. [facility] is not responsible for any belongings/valuables. Do not wear nail polish, gel polish, artificial nails, or any other type of covering on natural nails (fingers and toes) Do not wear jewelry or makeup Put on clean/comfortable clothes.  Please brush your teeth.  Ask your nurse before applying any prescription medications to the skin.     CHG Compatible Lotions   Aveeno Moisturizing lotion  Cetaphil Moisturizing Cream  Cetaphil Moisturizing Lotion  Clairol Herbal Essence Moisturizing Lotion, Dry Skin  Clairol Herbal Essence Moisturizing Lotion, Extra Dry Skin  Clairol Herbal Essence Moisturizing Lotion, Normal Skin  Curel Age Defying Therapeutic Moisturizing Lotion with Alpha  Hydroxy  Curel Extreme Care Body Lotion  Curel Soothing Hands Moisturizing Hand Lotion  Curel Therapeutic Moisturizing Cream, Fragrance-Free  Curel Therapeutic Moisturizing Lotion, Fragrance-Free  Curel Therapeutic Moisturizing Lotion, Original Formula  Eucerin Daily Replenishing Lotion  Eucerin Dry Skin Therapy Plus Alpha Hydroxy Crme  Eucerin Dry Skin Therapy Plus Alpha Hydroxy Lotion  Eucerin Original Crme  Eucerin Original Lotion  Eucerin Plus Crme Eucerin Plus Lotion  Eucerin TriLipid Replenishing Lotion  Keri Anti-Bacterial Hand Lotion  Keri Deep Conditioning Original Lotion Dry Skin Formula Softly Scented  Keri Deep Conditioning Original Lotion, Fragrance Free Sensitive Skin Formula  Keri Lotion Fast Absorbing Fragrance Free Sensitive Skin Formula  Keri Lotion Fast Absorbing Softly Scented Dry Skin Formula  Keri Original Lotion  Keri Skin Renewal Lotion Keri Silky Smooth Lotion  Keri Silky Smooth Sensitive Skin Lotion  Nivea Body Creamy Conditioning Oil  Nivea Body Extra Enriched Lotion  Nivea Body Original Lotion  Nivea Body Sheer Moisturizing Lotion Nivea Crme  Nivea Skin Firming Lotion  NutraDerm 30 Skin Lotion  NutraDerm Skin Lotion  NutraDerm Therapeutic Skin Cream  NutraDerm Therapeutic Skin Lotion  ProShield Protective Hand Cream  Provon moisturizing lotion  Please read over the following fact sheets that you were given.

## 2023-11-21 ENCOUNTER — Other Ambulatory Visit: Payer: Self-pay

## 2023-11-21 ENCOUNTER — Encounter (HOSPITAL_COMMUNITY): Payer: Self-pay

## 2023-11-21 ENCOUNTER — Encounter (HOSPITAL_COMMUNITY)
Admission: RE | Admit: 2023-11-21 | Discharge: 2023-11-21 | Disposition: A | Discharge status: Home / Self Care | Payer: 59 | Source: Ambulatory Visit | Attending: Orthopedic Surgery | Admitting: Orthopedic Surgery

## 2023-11-21 VITALS — BP 147/81 | HR 78 | Temp 97.7°F | Resp 18 | Ht 74.0 in | Wt 230.9 lb

## 2023-11-21 DIAGNOSIS — Z01818 Encounter for other preprocedural examination: Secondary | ICD-10-CM | POA: Diagnosis present

## 2023-11-21 DIAGNOSIS — R9431 Abnormal electrocardiogram [ECG] [EKG]: Secondary | ICD-10-CM | POA: Insufficient documentation

## 2023-11-21 LAB — GLUCOSE, CAPILLARY: Glucose-Capillary: 137 mg/dL — ABNORMAL HIGH (ref 70–99)

## 2023-11-21 LAB — CBC
HCT: 37.5 % — ABNORMAL LOW (ref 39.0–52.0)
Hemoglobin: 12.8 g/dL — ABNORMAL LOW (ref 13.0–17.0)
MCH: 27.9 pg (ref 26.0–34.0)
MCHC: 34.1 g/dL (ref 30.0–36.0)
MCV: 81.9 fL (ref 80.0–100.0)
Platelets: 286 10*3/uL (ref 150–400)
RBC: 4.58 MIL/uL (ref 4.22–5.81)
RDW: 15.3 % (ref 11.5–15.5)
WBC: 5.5 10*3/uL (ref 4.0–10.5)
nRBC: 0 % (ref 0.0–0.2)

## 2023-11-21 LAB — BASIC METABOLIC PANEL
Anion gap: 10 (ref 5–15)
BUN: 11 mg/dL (ref 8–23)
CO2: 23 mmol/L (ref 22–32)
Calcium: 9.4 mg/dL (ref 8.9–10.3)
Chloride: 101 mmol/L (ref 98–111)
Creatinine, Ser: 0.85 mg/dL (ref 0.61–1.24)
GFR, Estimated: >60 mL/min (ref >60)
Glucose, Bld: 139 mg/dL — ABNORMAL HIGH (ref 70–99)
Potassium: 3.6 mmol/L (ref 3.5–5.1)
Sodium: 134 mmol/L — ABNORMAL LOW (ref 135–145)

## 2023-11-21 LAB — HEMOGLOBIN A1C
Hgb A1c MFr Bld: 6.8 % — ABNORMAL HIGH (ref 4.8–5.6)
Mean Plasma Glucose: 148.46 mg/dL

## 2023-11-21 LAB — SURGICAL PCR SCREEN

## 2023-11-21 NOTE — Progress Notes (Signed)
PCP - Dr. Evangeline Dakin Cardiologist - DENIES  PPM/ICD - DENIES Device Orders - N/A Rep Notified - N/A  Chest x-ray - N/A EKG - 11/21/23 Stress Test - DENIES ECHO - DENIES Cardiac Cath - DENIES  Sleep Study - YES; DOES NOT WEAR A CPAP CPAP - N/A  Fasting Blood Sugar - 119-127 Checks Blood Sugar __1___ times a day  Last dose of GLP1 agonist-  11/18/23 GLP1 instructions: Y LAST DOSE IS 11/18/23  Blood Thinner Instructions: Y Aspirin Instructions: Y  ERAS Protcol - Y, CLEAR LIQUIDS UNTIL 4:30 AM. PRE-SURGERY Ensure or G2- NONE  COVID TEST- NO   Anesthesia review: N  Patient denies shortness of breath, fever, cough and chest pain at PAT appointment. Patient denies any shortness of breath at this time.    All instructions explained to the patient, with a verbal understanding of the material. Patient agrees to go over the instructions while at home for a better understanding. Patient also instructed to self quarantine after being tested for COVID-19. The opportunity to ask questions was provided.

## 2023-11-22 NOTE — Progress Notes (Signed)
Surgical PCR result invalid from pre-admission appointment. Will need to be recollected on DOS. Order placed.

## 2023-11-24 NOTE — Anesthesia Preprocedure Evaluation (Signed)
Anesthesia Evaluation  Patient identified by MRN, date of birth, ID band Patient awake    Reviewed: Allergy & Precautions, NPO status , Patient's Chart, lab work & pertinent test results, reviewed documented beta blocker date and time   Airway Mallampati: I  TM Distance: >3 FB Neck ROM: Full    Dental  (+) Dental Advisory Given, Missing,    Pulmonary sleep apnea (noncompliant with CPAP) , Current Smoker and Patient abstained from smoking.   Pulmonary exam normal breath sounds clear to auscultation       Cardiovascular hypertension, Pt. on medications and Pt. on home beta blockers Normal cardiovascular exam Rhythm:Regular Rate:Normal     Neuro/Psych  PSYCHIATRIC DISORDERS Anxiety Depression    negative neurological ROS     GI/Hepatic Neg liver ROS,GERD  ,,  Endo/Other  diabetes, Oral Hypoglycemic Agents    Renal/GU negative Renal ROS  negative genitourinary   Musculoskeletal  (+) Arthritis ,    Abdominal   Peds  Hematology negative hematology ROS (+)   Anesthesia Other Findings   Reproductive/Obstetrics                             Anesthesia Physical Anesthesia Plan  ASA: 3  Anesthesia Plan: General   Post-op Pain Management: Tylenol PO (pre-op)*   Induction: Intravenous  PONV Risk Score and Plan: 1 and Midazolam, Dexamethasone and Ondansetron  Airway Management Planned: Oral ETT and Video Laryngoscope Planned  Additional Equipment:   Intra-op Plan:   Post-operative Plan: Extubation in OR  Informed Consent: I have reviewed the patients History and Physical, chart, labs and discussed the procedure including the risks, benefits and alternatives for the proposed anesthesia with the patient or authorized representative who has indicated his/her understanding and acceptance.     Dental advisory given  Plan Discussed with: CRNA  Anesthesia Plan Comments: (2 IVs)        Anesthesia Quick Evaluation

## 2023-11-25 ENCOUNTER — Ambulatory Visit (HOSPITAL_COMMUNITY)
Admission: RE | Admit: 2023-11-25 | Discharge: 2023-11-26 | Disposition: A | Discharge status: Home / Self Care | Payer: 59 | Attending: Orthopedic Surgery | Admitting: Orthopedic Surgery

## 2023-11-25 ENCOUNTER — Other Ambulatory Visit: Payer: Self-pay

## 2023-11-25 ENCOUNTER — Ambulatory Visit (HOSPITAL_BASED_OUTPATIENT_CLINIC_OR_DEPARTMENT_OTHER): Payer: 59 | Admitting: Anesthesiology

## 2023-11-25 ENCOUNTER — Ambulatory Visit (HOSPITAL_COMMUNITY): Payer: 59 | Admitting: Anesthesiology

## 2023-11-25 ENCOUNTER — Ambulatory Visit (HOSPITAL_COMMUNITY): Payer: 59

## 2023-11-25 ENCOUNTER — Encounter (HOSPITAL_COMMUNITY): Payer: Self-pay | Admitting: Orthopedic Surgery

## 2023-11-25 ENCOUNTER — Encounter (HOSPITAL_COMMUNITY)
Admission: RE | Disposition: A | Discharge status: Home / Self Care | Payer: Self-pay | Source: Home / Self Care | Attending: Orthopedic Surgery

## 2023-11-25 DIAGNOSIS — Z7985 Long-term (current) use of injectable non-insulin antidiabetic drugs: Secondary | ICD-10-CM | POA: Insufficient documentation

## 2023-11-25 DIAGNOSIS — E785 Hyperlipidemia, unspecified: Secondary | ICD-10-CM | POA: Diagnosis not present

## 2023-11-25 DIAGNOSIS — K219 Gastro-esophageal reflux disease without esophagitis: Secondary | ICD-10-CM | POA: Diagnosis not present

## 2023-11-25 DIAGNOSIS — G4730 Sleep apnea, unspecified: Secondary | ICD-10-CM | POA: Diagnosis not present

## 2023-11-25 DIAGNOSIS — M4802 Spinal stenosis, cervical region: Secondary | ICD-10-CM | POA: Insufficient documentation

## 2023-11-25 DIAGNOSIS — Z79899 Other long term (current) drug therapy: Secondary | ICD-10-CM | POA: Insufficient documentation

## 2023-11-25 DIAGNOSIS — M2578 Osteophyte, vertebrae: Secondary | ICD-10-CM | POA: Insufficient documentation

## 2023-11-25 DIAGNOSIS — M1990 Unspecified osteoarthritis, unspecified site: Secondary | ICD-10-CM | POA: Diagnosis not present

## 2023-11-25 DIAGNOSIS — F419 Anxiety disorder, unspecified: Secondary | ICD-10-CM | POA: Insufficient documentation

## 2023-11-25 DIAGNOSIS — M4712 Other spondylosis with myelopathy, cervical region: Secondary | ICD-10-CM

## 2023-11-25 DIAGNOSIS — F32A Depression, unspecified: Secondary | ICD-10-CM | POA: Insufficient documentation

## 2023-11-25 DIAGNOSIS — I10 Essential (primary) hypertension: Secondary | ICD-10-CM | POA: Diagnosis not present

## 2023-11-25 DIAGNOSIS — F17200 Nicotine dependence, unspecified, uncomplicated: Secondary | ICD-10-CM | POA: Insufficient documentation

## 2023-11-25 DIAGNOSIS — E119 Type 2 diabetes mellitus without complications: Secondary | ICD-10-CM | POA: Insufficient documentation

## 2023-11-25 DIAGNOSIS — Z7984 Long term (current) use of oral hypoglycemic drugs: Secondary | ICD-10-CM | POA: Insufficient documentation

## 2023-11-25 DIAGNOSIS — G959 Disease of spinal cord, unspecified: Secondary | ICD-10-CM | POA: Diagnosis present

## 2023-11-25 DIAGNOSIS — Z01818 Encounter for other preprocedural examination: Secondary | ICD-10-CM

## 2023-11-25 HISTORY — PX: ANTERIOR CERVICAL DECOMP/DISCECTOMY FUSION: SHX1161

## 2023-11-25 LAB — GLUCOSE, CAPILLARY
Glucose-Capillary: 123 mg/dL — ABNORMAL HIGH (ref 70–99)
Glucose-Capillary: 138 mg/dL — ABNORMAL HIGH (ref 70–99)
Glucose-Capillary: 162 mg/dL — ABNORMAL HIGH (ref 70–99)
Glucose-Capillary: 196 mg/dL — ABNORMAL HIGH (ref 70–99)

## 2023-11-25 LAB — SURGICAL PCR SCREEN
MRSA, PCR: NEGATIVE
Staphylococcus aureus: NEGATIVE

## 2023-11-25 SURGERY — ANTERIOR CERVICAL DECOMPRESSION/DISCECTOMY FUSION 1 LEVEL
Anesthesia: General | Site: Spine Cervical

## 2023-11-25 MED ORDER — MIDAZOLAM HCL 2 MG/2ML IJ SOLN
INTRAMUSCULAR | Status: AC
  Filled 2023-11-25: qty 2

## 2023-11-25 MED ORDER — METHOCARBAMOL 500 MG PO TABS
500.0000 mg | ORAL_TABLET | Freq: Four times a day (QID) | ORAL | Status: DC | PRN
  Administered 2023-11-25 (×2): 500 mg via ORAL
  Filled 2023-11-25 (×2): qty 1

## 2023-11-25 MED ORDER — FENTANYL CITRATE (PF) 100 MCG/2ML IJ SOLN
INTRAMUSCULAR | Status: AC
  Filled 2023-11-25: qty 2

## 2023-11-25 MED ORDER — REMIFENTANIL HCL 2 MG IV SOLR
INTRAVENOUS | Status: DC | PRN
  Administered 2023-11-25: .15 ug/kg/min via INTRAVENOUS

## 2023-11-25 MED ORDER — HYDROCHLOROTHIAZIDE 25 MG PO TABS
25.0000 mg | ORAL_TABLET | Freq: Every day | ORAL | Status: DC
  Administered 2023-11-25: 25 mg via ORAL
  Filled 2023-11-25: qty 1

## 2023-11-25 MED ORDER — INSULIN ASPART 100 UNIT/ML IJ SOLN: 0.0000 [IU] | Freq: Every day | INTRAMUSCULAR | Status: DC

## 2023-11-25 MED ORDER — ONDANSETRON HCL 4 MG PO TABS
4.0000 mg | ORAL_TABLET | Freq: Four times a day (QID) | ORAL | Status: DC | PRN
Start: 2023-11-25 — End: 2023-11-26

## 2023-11-25 MED ORDER — SUCCINYLCHOLINE CHLORIDE 200 MG/10ML IV SOSY
PREFILLED_SYRINGE | INTRAVENOUS | Status: AC
  Filled 2023-11-25: qty 10

## 2023-11-25 MED ORDER — DEXAMETHASONE SODIUM PHOSPHATE 10 MG/ML IJ SOLN
INTRAMUSCULAR | Status: AC
  Filled 2023-11-25: qty 1

## 2023-11-25 MED ORDER — INSULIN ASPART 100 UNIT/ML IJ SOLN: 0.0000 [IU] | Freq: Three times a day (TID) | INTRAMUSCULAR | Status: DC

## 2023-11-25 MED ORDER — INSULIN ASPART 100 UNIT/ML IJ SOLN: 0.0000 [IU] | INTRAMUSCULAR | Status: DC | PRN

## 2023-11-25 MED ORDER — CEFAZOLIN SODIUM-DEXTROSE 2-4 GM/100ML-% IV SOLN
INTRAVENOUS | Status: AC
  Filled 2023-11-25: qty 100

## 2023-11-25 MED ORDER — OXYCODONE HCL 5 MG PO TABS: 10.0000 mg | ORAL_TABLET | ORAL | Status: DC | PRN

## 2023-11-25 MED ORDER — FENTANYL CITRATE (PF) 100 MCG/2ML IJ SOLN
25.0000 ug | INTRAMUSCULAR | Status: DC | PRN
  Administered 2023-11-25: 50 ug via INTRAVENOUS

## 2023-11-25 MED ORDER — GLIPIZIDE ER 2.5 MG PO TB24
2.5000 mg | ORAL_TABLET | Freq: Every day | ORAL | Status: DC
  Filled 2023-11-25: qty 1

## 2023-11-25 MED ORDER — DEXAMETHASONE SODIUM PHOSPHATE 10 MG/ML IJ SOLN
INTRAMUSCULAR | Status: DC | PRN
  Administered 2023-11-25: 10 mg via INTRAVENOUS

## 2023-11-25 MED ORDER — FENTANYL CITRATE (PF) 250 MCG/5ML IJ SOLN
INTRAMUSCULAR | Status: AC
  Filled 2023-11-25: qty 5

## 2023-11-25 MED ORDER — THROMBIN 20000 UNITS EX SOLR: CUTANEOUS | Status: DC | PRN

## 2023-11-25 MED ORDER — TRANEXAMIC ACID-NACL 1000-0.7 MG/100ML-% IV SOLN
1000.0000 mg | INTRAVENOUS | Status: AC
  Administered 2023-11-25: 1000 mg via INTRAVENOUS

## 2023-11-25 MED ORDER — LIDOCAINE 2% (20 MG/ML) 5 ML SYRINGE
INTRAMUSCULAR | Status: DC | PRN
  Administered 2023-11-25: 100 mg via INTRAVENOUS

## 2023-11-25 MED ORDER — ATORVASTATIN CALCIUM 10 MG PO TABS
20.0000 mg | ORAL_TABLET | Freq: Every day | ORAL | Status: DC
  Administered 2023-11-25: 20 mg via ORAL
  Filled 2023-11-25: qty 2

## 2023-11-25 MED ORDER — SUCCINYLCHOLINE CHLORIDE 200 MG/10ML IV SOSY
PREFILLED_SYRINGE | INTRAVENOUS | Status: DC | PRN
  Administered 2023-11-25: 200 mg via INTRAVENOUS

## 2023-11-25 MED ORDER — OXYCODONE HCL 5 MG PO TABS
5.0000 mg | ORAL_TABLET | ORAL | Status: DC | PRN
Start: 2023-11-25 — End: 2023-11-26
  Administered 2023-11-25 (×3): 5 mg via ORAL
  Filled 2023-11-25 (×4): qty 1

## 2023-11-25 MED ORDER — ACETAMINOPHEN 325 MG PO TABS: 650.0000 mg | ORAL_TABLET | ORAL | Status: DC | PRN

## 2023-11-25 MED ORDER — CEFAZOLIN SODIUM-DEXTROSE 2-4 GM/100ML-% IV SOLN
2.0000 g | INTRAVENOUS | Status: AC
  Administered 2023-11-25: 2 g via INTRAVENOUS

## 2023-11-25 MED ORDER — SODIUM CHLORIDE 0.9% FLUSH: 3.0000 mL | INTRAVENOUS | Status: DC | PRN

## 2023-11-25 MED ORDER — ALUM & MAG HYDROXIDE-SIMETH 200-200-20 MG/5ML PO SUSP
15.0000 mL | ORAL | Status: DC | PRN
  Administered 2023-11-25 – 2023-11-26 (×2): 15 mL via ORAL
  Filled 2023-11-25 (×2): qty 30

## 2023-11-25 MED ORDER — METFORMIN HCL 500 MG PO TABS
1000.0000 mg | ORAL_TABLET | Freq: Two times a day (BID) | ORAL | Status: DC
  Administered 2023-11-25: 1000 mg via ORAL
  Filled 2023-11-25: qty 2

## 2023-11-25 MED ORDER — SODIUM CHLORIDE 0.9 % IV SOLN
0.1500 ug/kg/min | INTRAVENOUS | Status: DC
  Filled 2023-11-25: qty 2000

## 2023-11-25 MED ORDER — OXYCODONE-ACETAMINOPHEN 10-325 MG PO TABS: 1.0000 | ORAL_TABLET | Freq: Four times a day (QID) | ORAL | 0 refills | Status: AC | PRN

## 2023-11-25 MED ORDER — METOPROLOL SUCCINATE ER 25 MG PO TB24
25.0000 mg | ORAL_TABLET | Freq: Every day | ORAL | Status: DC
Start: 2023-11-26 — End: 2023-11-26

## 2023-11-25 MED ORDER — MIDAZOLAM HCL 2 MG/2ML IJ SOLN
INTRAMUSCULAR | Status: DC | PRN
  Administered 2023-11-25: 2 mg via INTRAVENOUS

## 2023-11-25 MED ORDER — HYDROMORPHONE HCL 1 MG/ML IJ SOLN: 1.0000 mg | INTRAMUSCULAR | Status: AC | PRN

## 2023-11-25 MED ORDER — ACETAMINOPHEN 500 MG PO TABS: 1000.0000 mg | ORAL_TABLET | Freq: Once | ORAL | Status: AC

## 2023-11-25 MED ORDER — LACTATED RINGERS IV SOLN: INTRAVENOUS | Status: DC

## 2023-11-25 MED ORDER — PHENYLEPHRINE 80 MCG/ML (10ML) SYRINGE FOR IV PUSH (FOR BLOOD PRESSURE SUPPORT)
PREFILLED_SYRINGE | INTRAVENOUS | Status: DC | PRN
  Administered 2023-11-25 (×4): 80 ug via INTRAVENOUS
  Administered 2023-11-25: 160 ug via INTRAVENOUS
  Administered 2023-11-25: 80 ug via INTRAVENOUS
  Administered 2023-11-25: 160 ug via INTRAVENOUS
  Administered 2023-11-25: 80 ug via INTRAVENOUS

## 2023-11-25 MED ORDER — PHENOL 1.4 % MT LIQD: 1.0000 | OROMUCOSAL | Status: DC | PRN

## 2023-11-25 MED ORDER — MENTHOL 3 MG MT LOZG: 1.0000 | LOZENGE | OROMUCOSAL | Status: DC | PRN

## 2023-11-25 MED ORDER — PROPOFOL 10 MG/ML IV BOLUS
INTRAVENOUS | Status: DC | PRN
  Administered 2023-11-25: 50 mg via INTRAVENOUS
  Administered 2023-11-25: 150 mg via INTRAVENOUS

## 2023-11-25 MED ORDER — PAROXETINE HCL 20 MG PO TABS
20.0000 mg | ORAL_TABLET | Freq: Every day | ORAL | Status: DC
  Filled 2023-11-25: qty 1

## 2023-11-25 MED ORDER — INSULIN ASPART 100 UNIT/ML IJ SOLN
0.0000 [IU] | Freq: Three times a day (TID) | INTRAMUSCULAR | Status: DC
Start: 2023-11-25 — End: 2023-11-25
  Administered 2023-11-25: 2 [IU] via SUBCUTANEOUS
  Administered 2023-11-25: 3 [IU] via SUBCUTANEOUS

## 2023-11-25 MED ORDER — LIDOCAINE 2% (20 MG/ML) 5 ML SYRINGE
INTRAMUSCULAR | Status: AC
  Filled 2023-11-25: qty 5

## 2023-11-25 MED ORDER — ONDANSETRON HCL 4 MG/2ML IJ SOLN: 4.0000 mg | Freq: Four times a day (QID) | INTRAMUSCULAR | Status: DC | PRN

## 2023-11-25 MED ORDER — CHLORHEXIDINE GLUCONATE 0.12 % MT SOLN
15.0000 mL | Freq: Once | OROMUCOSAL | Status: AC
Start: 2023-11-25 — End: 2023-11-25

## 2023-11-25 MED ORDER — THROMBIN 20000 UNITS EX KIT
PACK | CUTANEOUS | Status: AC
  Filled 2023-11-25: qty 1

## 2023-11-25 MED ORDER — SODIUM CHLORIDE 0.9 % IV SOLN
250.0000 mL | INTRAVENOUS | Status: AC
  Administered 2023-11-25: 250 mL via INTRAVENOUS

## 2023-11-25 MED ORDER — BUPIVACAINE-EPINEPHRINE (PF) 0.25% -1:200000 IJ SOLN
INTRAMUSCULAR | Status: AC
  Filled 2023-11-25: qty 30

## 2023-11-25 MED ORDER — PHENYLEPHRINE 80 MCG/ML (10ML) SYRINGE FOR IV PUSH (FOR BLOOD PRESSURE SUPPORT)
PREFILLED_SYRINGE | INTRAVENOUS | Status: AC
  Filled 2023-11-25: qty 10

## 2023-11-25 MED ORDER — POLYETHYLENE GLYCOL 3350 17 G PO PACK: 17.0000 g | PACK | Freq: Every day | ORAL | Status: DC | PRN

## 2023-11-25 MED ORDER — EPHEDRINE SULFATE-NACL 50-0.9 MG/10ML-% IV SOSY
PREFILLED_SYRINGE | INTRAVENOUS | Status: DC | PRN
  Administered 2023-11-25 (×2): 5 mg via INTRAVENOUS

## 2023-11-25 MED ORDER — BUPIVACAINE-EPINEPHRINE 0.25% -1:200000 IJ SOLN
INTRAMUSCULAR | Status: DC | PRN
  Administered 2023-11-25: 10 mL

## 2023-11-25 MED ORDER — 0.9 % SODIUM CHLORIDE (POUR BTL) OPTIME
TOPICAL | Status: DC | PRN
  Administered 2023-11-25: 1000 mL

## 2023-11-25 MED ORDER — ROCURONIUM BROMIDE 10 MG/ML (PF) SYRINGE
PREFILLED_SYRINGE | INTRAVENOUS | Status: DC | PRN
  Administered 2023-11-25: 10 mg via INTRAVENOUS

## 2023-11-25 MED ORDER — SODIUM CHLORIDE 0.9% FLUSH
3.0000 mL | Freq: Two times a day (BID) | INTRAVENOUS | Status: DC
  Administered 2023-11-25 (×2): 3 mL via INTRAVENOUS

## 2023-11-25 MED ORDER — PROPOFOL 500 MG/50ML IV EMUL
INTRAVENOUS | Status: DC | PRN
  Administered 2023-11-25: 100 ug/kg/min via INTRAVENOUS

## 2023-11-25 MED ORDER — CHLORHEXIDINE GLUCONATE 0.12 % MT SOLN
OROMUCOSAL | Status: AC
  Administered 2023-11-25: 15 mL via OROMUCOSAL
  Filled 2023-11-25: qty 15

## 2023-11-25 MED ORDER — ACETAMINOPHEN 650 MG RE SUPP: 650.0000 mg | RECTAL | Status: DC | PRN

## 2023-11-25 MED ORDER — ONDANSETRON HCL 4 MG/2ML IJ SOLN
INTRAMUSCULAR | Status: DC | PRN
  Administered 2023-11-25: 4 mg via INTRAVENOUS

## 2023-11-25 MED ORDER — EPHEDRINE 5 MG/ML INJ
INTRAVENOUS | Status: AC
  Filled 2023-11-25: qty 5

## 2023-11-25 MED ORDER — LACTATED RINGERS IV SOLN: INTRAVENOUS | Status: AC

## 2023-11-25 MED ORDER — SURGIFLO WITH THROMBIN (HEMOSTATIC MATRIX KIT) OPTIME
TOPICAL | Status: DC | PRN
  Administered 2023-11-25: 1 via TOPICAL

## 2023-11-25 MED ORDER — CEFAZOLIN SODIUM-DEXTROSE 1-4 GM/50ML-% IV SOLN
1.0000 g | Freq: Three times a day (TID) | INTRAVENOUS | Status: AC
  Administered 2023-11-25 (×2): 1 g via INTRAVENOUS
  Filled 2023-11-25 (×2): qty 50

## 2023-11-25 MED ORDER — ACETAMINOPHEN 500 MG PO TABS
ORAL_TABLET | ORAL | Status: AC
  Administered 2023-11-25: 1000 mg via ORAL
  Filled 2023-11-25: qty 2

## 2023-11-25 MED ORDER — ONDANSETRON HCL 4 MG PO TABS: 4.0000 mg | ORAL_TABLET | Freq: Three times a day (TID) | ORAL | 0 refills | Status: DC | PRN

## 2023-11-25 MED ORDER — PROPOFOL 10 MG/ML IV BOLUS
INTRAVENOUS | Status: AC
  Filled 2023-11-25: qty 20

## 2023-11-25 MED ORDER — AMLODIPINE BESYLATE 10 MG PO TABS: 10.0000 mg | ORAL_TABLET | Freq: Every day | ORAL | Status: DC

## 2023-11-25 MED ORDER — METHOCARBAMOL 500 MG PO TABS: 500.0000 mg | ORAL_TABLET | Freq: Three times a day (TID) | ORAL | 0 refills | Status: AC | PRN

## 2023-11-25 MED ORDER — ACETAMINOPHEN 500 MG PO TABS
1000.0000 mg | ORAL_TABLET | Freq: Four times a day (QID) | ORAL | Status: DC
  Administered 2023-11-25 (×3): 1000 mg via ORAL
  Filled 2023-11-25 (×4): qty 2

## 2023-11-25 MED ORDER — FENTANYL CITRATE (PF) 250 MCG/5ML IJ SOLN
INTRAMUSCULAR | Status: DC | PRN
  Administered 2023-11-25: 50 ug via INTRAVENOUS
  Administered 2023-11-25: 100 ug via INTRAVENOUS

## 2023-11-25 MED ORDER — MAGNESIUM CITRATE PO SOLN: 1.0000 | Freq: Once | ORAL | Status: DC | PRN

## 2023-11-25 MED ORDER — METHOCARBAMOL 1000 MG/10ML IJ SOLN: 500.0000 mg | Freq: Four times a day (QID) | INTRAMUSCULAR | Status: DC | PRN

## 2023-11-25 MED ORDER — SENNA 8.6 MG PO TABS
1.0000 | ORAL_TABLET | Freq: Two times a day (BID) | ORAL | Status: DC | PRN
  Administered 2023-11-25: 8.6 mg via ORAL
  Filled 2023-11-25: qty 1

## 2023-11-25 MED ORDER — DOCUSATE SODIUM 100 MG PO CAPS
100.0000 mg | ORAL_CAPSULE | Freq: Two times a day (BID) | ORAL | Status: DC | PRN
  Administered 2023-11-25: 100 mg via ORAL
  Filled 2023-11-25: qty 1

## 2023-11-25 MED ORDER — ALPRAZOLAM 0.5 MG PO TABS
1.0000 mg | ORAL_TABLET | Freq: Three times a day (TID) | ORAL | Status: DC | PRN
  Administered 2023-11-25 (×2): 1 mg via ORAL
  Filled 2023-11-25 (×2): qty 2

## 2023-11-25 MED ORDER — ROCURONIUM BROMIDE 10 MG/ML (PF) SYRINGE
PREFILLED_SYRINGE | INTRAVENOUS | Status: AC
  Filled 2023-11-25: qty 10

## 2023-11-25 MED ORDER — METFORMIN HCL 500 MG PO TABS
1000.0000 mg | ORAL_TABLET | Freq: Two times a day (BID) | ORAL | Status: DC
  Filled 2023-11-25: qty 2

## 2023-11-25 MED ORDER — ORAL CARE MOUTH RINSE: 15.0000 mL | Freq: Once | OROMUCOSAL | Status: AC

## 2023-11-25 MED ORDER — TRANEXAMIC ACID-NACL 1000-0.7 MG/100ML-% IV SOLN
INTRAVENOUS | Status: AC
  Filled 2023-11-25: qty 100

## 2023-11-25 MED FILL — Remifentanil HCl For IV Soln 2 MG: INTRAVENOUS | Qty: 2 | Status: AC

## 2023-11-25 MED FILL — Sodium Chloride IV Soln 0.9%: INTRAVENOUS | Qty: 100 | Status: AC

## 2023-11-25 SURGICAL SUPPLY — 61 items
BAG COUNTER SPONGE SURGICOUNT (BAG) ×2 IMPLANT
BLADE CLIPPER SURG (BLADE) IMPLANT
CABLE BIPOLOR RESECTION CORD (MISCELLANEOUS) ×2 IMPLANT
CAGE LORD REVEL-S 12X14 7D (Cage) IMPLANT
CANISTER SUCT 3000ML PPV (MISCELLANEOUS) ×2 IMPLANT
CLSR STERI-STRIP ANTIMIC 1/2X4 (GAUZE/BANDAGES/DRESSINGS) ×2 IMPLANT
COVER MAYO STAND STRL (DRAPES) ×6 IMPLANT
COVER SURGICAL LIGHT HANDLE (MISCELLANEOUS) ×4 IMPLANT
DRAIN CHANNEL 15F RND FF W/TCR (WOUND CARE) IMPLANT
DRAPE C-ARM 42X72 X-RAY (DRAPES) ×2 IMPLANT
DRAPE POUCH INSTRU U-SHP 10X18 (DRAPES) ×2 IMPLANT
DRAPE SURG 17X23 STRL (DRAPES) ×2 IMPLANT
DRAPE U-SHAPE 47X51 STRL (DRAPES) ×2 IMPLANT
DRSG OPSITE POSTOP 3X4 (GAUZE/BANDAGES/DRESSINGS) ×2 IMPLANT
DRSG OPSITE POSTOP 4X6 (GAUZE/BANDAGES/DRESSINGS) IMPLANT
DURAPREP 26ML APPLICATOR (WOUND CARE) ×2 IMPLANT
ELECT COATED BLADE 2.86 ST (ELECTRODE) ×2 IMPLANT
ELECT NVM5 SURFACE MEP/EMG (ELECTRODE) IMPLANT
ELECT PENCIL ROCKER SW 15FT (MISCELLANEOUS) ×2 IMPLANT
ELECT REM PT RETURN 9FT ADLT (ELECTROSURGICAL) ×1 IMPLANT
ELECTRODE REM PT RTRN 9FT ADLT (ELECTROSURGICAL) ×2 IMPLANT
GLOVE BIO SURGEON STRL SZ 6.5 (GLOVE) ×2 IMPLANT
GLOVE BIOGEL PI IND STRL 6.5 (GLOVE) ×2 IMPLANT
GLOVE BIOGEL PI IND STRL 8.5 (GLOVE) ×2 IMPLANT
GLOVE SS BIOGEL STRL SZ 8.5 (GLOVE) ×2 IMPLANT
GOWN STRL REUS W/ TWL LRG LVL3 (GOWN DISPOSABLE) ×2 IMPLANT
GOWN STRL REUS W/TWL 2XL LVL3 (GOWN DISPOSABLE) ×2 IMPLANT
KIT BASIN OR (CUSTOM PROCEDURE TRAY) ×2 IMPLANT
KIT TURNOVER KIT B (KITS) ×2 IMPLANT
MODULE EMG NDL SSEP NVM5 (NEUROSURGERY SUPPLIES) IMPLANT
MODULE EMG NEEDLE SSEP NVM5 (NEUROSURGERY SUPPLIES) ×1 IMPLANT
NDL HYPO 22X1.5 SAFETY MO (MISCELLANEOUS) ×2 IMPLANT
NDL SPNL 18GX3.5 QUINCKE PK (NEEDLE) ×2 IMPLANT
NEEDLE HYPO 22X1.5 SAFETY MO (MISCELLANEOUS) ×1 IMPLANT
NEEDLE SPNL 18GX3.5 QUINCKE PK (NEEDLE) ×1 IMPLANT
NS IRRIG 1000ML POUR BTL (IV SOLUTION) ×2 IMPLANT
PACK ORTHO CERVICAL (CUSTOM PROCEDURE TRAY) ×2 IMPLANT
PACK UNIVERSAL I (CUSTOM PROCEDURE TRAY) ×2 IMPLANT
PAD ARMBOARD 7.5X6 YLW CONV (MISCELLANEOUS) ×4 IMPLANT
PATTIES SURGICAL .25X.25 (GAUZE/BANDAGES/DRESSINGS) IMPLANT
PIN DISTRATION 14MM (PIN) IMPLANT
POSITIONER HEAD DONUT 9IN (MISCELLANEOUS) ×2 IMPLANT
PUTTY BONE DBX 2.5 MIS (Bone Implant) IMPLANT
PUTTY DBX 1CC (Putty) ×1 IMPLANT
PUTTY DBX 1CC DEPUY (Putty) IMPLANT
RESTRAINT LIMB HOLDER UNIV (RESTRAINTS) ×2 IMPLANT
SCREW SELF DRILL 3.6MM14MM (Screw) IMPLANT
SPONGE INTESTINAL PEANUT (DISPOSABLE) ×2 IMPLANT
SPONGE SURGIFOAM ABS GEL SZ50 (HEMOSTASIS) ×2 IMPLANT
SURGIFLO W/THROMBIN 8M KIT (HEMOSTASIS) IMPLANT
SUT BONE WAX W31G (SUTURE) ×2 IMPLANT
SUT MNCRL AB 3-0 PS2 27 (SUTURE) ×2 IMPLANT
SUT SILK 2 0 TIES 10X30 (SUTURE) IMPLANT
SUT VIC AB 2-0 CT1 18 (SUTURE) ×2 IMPLANT
SYR BULB IRRIG 60ML STRL (SYRINGE) ×2 IMPLANT
SYR CONTROL 10ML LL (SYRINGE) ×2 IMPLANT
TAPE CLOTH 4X10 WHT NS (GAUZE/BANDAGES/DRESSINGS) ×2 IMPLANT
TAPE UMBILICAL 1/8X30 (MISCELLANEOUS) ×2 IMPLANT
TOWEL GREEN STERILE (TOWEL DISPOSABLE) ×2 IMPLANT
TOWEL GREEN STERILE FF (TOWEL DISPOSABLE) ×2 IMPLANT
WATER STERILE IRR 1000ML POUR (IV SOLUTION) ×2 IMPLANT

## 2023-11-25 NOTE — Brief Op Note (Signed)
11/25/2023  10:31 AM  PATIENT:  Hortencia Conradi  62 y.o. 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

## 2023-11-25 NOTE — Discharge Instructions (Signed)

## 2023-11-25 NOTE — H&P (Signed)
History: Scott Schaefer is a very pleasant 62 year old gentleman with progressive cervical spondylitic myelopathy. He has C3-4 degenerative cervical disease that is causing cord compression. He has noted progressive decreased manual dexterity and difficulty walking. as a result of the myelopathic findings we have elected to move forward with surgery.  Past Medical History:  Diagnosis Date   Anxiety    Arthritis    Depression    Diabetes mellitus    GERD (gastroesophageal reflux disease)    Hyperlipidemia    Hypertension    Sleep apnea    has cpap but does not use    Allergies  Allergen Reactions   Lisinopril Anaphylaxis   Ace Inhibitors Other (See Comments)    Angioedema - history of angioedema in the past, history of ACE inhibitor use, questionable confounding factor with consumption of fish    No current facility-administered medications on file prior to encounter.   Current Outpatient Medications on File Prior to Encounter  Medication Sig Dispense Refill   ALPRAZolam (XANAX) 1 MG tablet Take 1 mg by mouth 3 (three) times daily as needed for anxiety.      amLODipine (NORVASC) 10 MG tablet Take 10 mg by mouth daily.     atorvastatin (LIPITOR) 20 MG tablet Take 20 mg by mouth daily.     glipiZIDE (GLUCOTROL XL) 2.5 MG 24 hr tablet Take 2.5 mg by mouth daily with breakfast.     hydrochlorothiazide (HYDRODIURIL) 25 MG tablet Take 1 tablet (25 mg total) by mouth daily. 30 tablet 3   metFORMIN (GLUCOPHAGE) 1000 MG tablet Take 1,000 mg by mouth 2 (two) times daily with a meal.     metoprolol succinate (TOPROL-XL) 25 MG 24 hr tablet Take 25 mg by mouth daily.     PARoxetine (PAXIL) 20 MG tablet Take 20 mg by mouth every morning.      TRULICITY 1.5 MG/0.5ML SOPN Inject 1.5 mg into the skin every 7 (seven) days.     Accu-Chek FastClix Lancets MISC Apply topically.     sildenafil (VIAGRA) 100 MG tablet 1 TABLET AS NEEDED ORALLY ONCE A DAY 30 DAY(S)      Physical Exam: Vitals:    11/25/23 0558 11/25/23 0658  BP: (!) 179/86 (!) 145/85  Pulse: 75   Resp: 18   Temp: 98.3 F (36.8 C)   SpO2: 99%    Body mass index is 29.27 kg/m. Patient is AAOX3, well developed and well nourished, NAD Ambulation: ataxic gait pattern, uses no assistive device. Patient is unable to heel toe ambulate. Positive Romberg's test Inspection: No obvious deformity, scoliosis, kyphosis, loss of lordotic curve. Heart: RRR Lungs: Normal pulmonary effort, no signs of respiratory distress Abdomen: Normal BSX4, non-tender, non-distended, no hepatosplenomegaly. Skin: No abnormal lesions, abrasions, or contusions. Palpation: Non-tender over spinous processes and neck musculature. AROM of cervical spine: - Fwd Flexion: decreased and painful - Extension: decreased and painful - Lateral bending to left: decreased and painful - Lateral Bending to right: decreased and painful - Rotation to Left: decreased and painful - Rotation to Right: decreased and painful -Shoulder, elbow, and wrists AROM normal and pain free. Dermatomes: UE dermatomes intact to light touch bilaterally with dysesthesia bilaterally. Decreased sensation to touch in bilateral digits.  Myotomes: - shoulder shrug: Left 5/5, Right 5/5 -Shoulder Abduction: Left 5/5, Right 5/5 - Elbow flexion: Left 5/5, Right 5/5 - Elbow extension Left 5/5, Right 5/5 - Finger abduction: Left 5/5, Right 5/5 - finger Adduction/squeeze: Left 4/5, Right 4/5  Reflexes: - Biceps: Left2+, Right 2+ - Brachioradialius: Left3+, Right 2+ - Triceps: Left 2+, Right 2+ - Hoffman's: Positive   Special Tests:  -Rhomberg: Positive - Patient has a positive ataxic gait pattern consistent with myelopathy. -Spurling: Left: Positive Right: Positive  PV: Extremities warm and well perfused. Distal pulses 2+ bilaterally.  X-Ray impression: No new imaging from today's visit  MRI Impression: The Cervical MRI preformed on 10/25/2023 showed Mild elevated cord  signal changes with C3-C4 cord impingement without loss of cord volume. Significant degenerative disease and severe stenosis at multi-levels of the cervical spine from C3-C7.  A/P:  Assessment: Scott Schaefer is a very pleasant 62 year old male who presents with neck pain with radicular symptoms of the bilateral upper extremities. He reports that the neck pain as been ongoing for months now but that the new onset numbness, tingling, and weakness of the bilateral hands and the decreased balance has recently started a few weeks ago.   The MRI report did show cord signal changes with impingement at C3-C4. On exam he is having radicular symptoms of the bilateral upper extremities. unfortunately, he is having decreased sensation to the digits on his bilateral hands with 4/5 grip strength bilaterally. His gait is ataxic and he has a positive Hoffman's on the left side with hyperreflexia of the upper extremity on the left. At this time we do believe that surgical intervention is warranted due to his myelopathic symptoms. We have discussed the surgical procedure warranted with the patient and all questions were welcomed and answered. The patient understood and agreed to surgical intervention. Therefore, we will move forward in the process with a ACDF of C3-C4.   Risks and benefits of surgery were discussed with the patient. These include: Infection, bleeding, death, stroke, paralysis, ongoing or worse pain, need for additional surgery, nonunion, leak of spinal fluid, adjacent segment degeneration requiring additional fusion surgery. Pseudoarthrosis (nonunion)requiring supplemental posterior fixation. Throat pain, swallowing difficulties, hoarseness or change in voice.  The goal of surgery is to reduce his pain and improve his quality of life. I also explained to the patient that the primary goal of the myelopathy is to prevent worsening of his symptoms. I did indicate there is the opportunity for improvement but I stressed  the primary reason for surgery is to prevent worsening of myelopathy. Impression: Cervical radiculopathy with myelopathy

## 2023-11-25 NOTE — Op Note (Signed)
OPERATIVE REPORT  DATE OF SURGERY: 11/25/2023  PATIENT NAME:  Scott Schaefer MRN: [id number] DOB: Sep 10, 1961  PCP: Collene Mares, PA  PRE-OPERATIVE DIAGNOSIS: Cervical spondylitic myelopathy C3-4  POST-OPERATIVE DIAGNOSIS: Same  PROCEDURE:   ACDF C3-4  SURGEON:  Venita Lick, MD  PHYSICIAN ASSISTANT: Roderic Palau  ANESTHESIA:   General  EBL: See anesthesia report   Complications: None  Implants: Nu[employer] revel-s 0 profile cervical expandable cage.  5 x 14 x 12 with 7 degree lordosis.  Cage expanded to 6.5 mm height.  14 mm locking screws.  Graft: DBX mix  Neuromonitoring: No adverse free running EMG, SSEP, or evoked motor potentials throughout the case.  Patient remained stable.  BRIEF HISTORY: Scott Schaefer is a 62 y.o. male who presented to my office with progressive signs of cervical myelopathy.  Imaging studies demonstrated cord signal changes at C3-4 with a hard disk osteophyte formation.  As result of the myelopathic findings we elected to move forward with surgery.  All appropriate risks, benefits, alternatives were discussed with the patient and consent was obtained.  PROCEDURE DETAILS: Patient was brought into the operating room and was properly positioned on the operating room table.  After induction with general anesthesia the patient was endotracheally intubated.  A timeout was taken to confirm all important data: including patient, procedure, and the level. Teds, SCD's were applied.  The neuromonitoring rep placed all appropriate needles for intraoperative SSEP and evoked motor potential monitoring.  Anterior cervical spine was prepped and draped in a standard fashion.  Using fluoroscopy I marked out my incision site over the C3-4 disc space.  This was infiltrated with quarter percent Marcaine with epinephrine.  Transverse left-sided incision with sharp dissection was carried out down to and through the platysma.  I continued performing a standard  Smith-Robinson approach through the avascular plane.  I dissected along the medial border the sternocleidomastoid through the remainder of the deep cervical and prevertebral fascia.  I mobilized the esophagus to the right with my finger and palpated and protected the carotid sheath laterally.  Hand-held retractor was then placed and I removed the remainder of the prevertebral fascia with Kitner dissectors.  A needle was placed into the C3-4 disc space and an x-ray was taken.  I confirmed that I was at the appropriate level.  I mobilized the longus coli muscle and using bipolar electrocautery from the mid body of C3 to the mid body of C4.  I placed the Caspar retractors underneath the longus coli muscle deflated the endotracheal cuff and expanded the retractor system.  I now had excellent visualization of the C3-4 disc space.  Annulotomy was performed with a 15 blade scalpel and I remove the bulk of the disc material with pituitary rongeurs.  The overhanging osteophyte from the inferior aspect of the C3 vertebral body was taken down with a Kerrison rongeur.  I continue to work posteriorly.  Distraction pins were then placed into the vertebral body of C3 and C4 and I distracted the intervertebral space and maintained it with the distraction pin set.  Using neuro curettes I continued dissecting posteriorly until I was at the posterior annulus.  I then used a 1 mm Kerrison rongeur to trim down the posterior annulus.  Using a fine nerve hook I dissected through the posterior annulus and posterior longitudinal ligament until I was able to create a plane between the thecal sac and the PLL.  Using a 1 mm Kerrison rongeur I resected the PLL.  I was  now able to undercut the uncovertebral joint and further decompress the nerve root.  I then placed a lamina spreader in the disc space and under live fluoroscopy confirmed that parallel endplate distraction.  He also was able to pass my nerve hook behind the vertebral bodies of  C3 and C4 and under the uncovertebral joints confirmed with fluoroscopy.  Once the decompression/discectomy proved satisfactory I rasped the endplates and then trialed the intervertebral spacers.  I elected to use the 5 mm small expandable cage.  This would provide satisfactory expansion with minimal trauma to the endplates upon insertion.  The cage was obtained and packed with DBX mix and was gently inserted into the disc space.  I counter sunk it so that the posterior aspect was as far posterior as I felt comfortable.  With this 0 profile device now countersunk I expanded it approximately 1.5 mm so had good contact with the endplates.  I then backfilled the cage with DBX.  A drill was used to score the cortex to allow for the 2 locking screws to be placed.  Both screws had excellent purchase.  I then engaged the locking device to prevent screws from backing out.  The distraction pins were now removed and the hole was sealed with bone wax.  The wound was now copiously irrigated with normal saline.  Retractors were removed and final x-rays were taken.  They demonstrate satisfactory positioning of the implant in both the AP and lateral planes.  At this point I copiously irrigated and confirmed hemostasis.  Final neuromonitoring remained stable.  The platysma was then closed with interrupted 2-0 Vicryl sutures and the skin with 3-0 Monocryl.  Steri-Strips and a dry dressing were applied and the patient was ultimately extubated transferred to the PACU without incident.  The end of the case all needle sponge counts were correct.  Venita Lick, MD 11/25/2023 10:21 AM

## 2023-11-25 NOTE — Anesthesia Postprocedure Evaluation (Signed)
Anesthesia Post Note  Patient: Scott Schaefer  Procedure(s) Performed: ANTERIOR CERVICAL DECOMPRESSION/DISCECTOMY FUSION 1 LEVEL C3-4 (Spine Cervical)     Patient location during evaluation: PACU Anesthesia Type: General Level of consciousness: awake and alert Pain management: pain level controlled Vital Signs Assessment: post-procedure vital signs reviewed and stable Respiratory status: spontaneous breathing, nonlabored ventilation, respiratory function stable and patient connected to nasal cannula oxygen Cardiovascular status: blood pressure returned to baseline and stable Postop Assessment: no apparent nausea or vomiting Anesthetic complications: no  No notable events documented.  Last Vitals:  Vitals:   11/25/23 1130 11/25/23 1146  BP: (!) 159/92 (!) 148/87  Pulse: 74 72  Resp: 16 20  Temp: 36.5 C 36.6 C  SpO2: 93% 95%    Last Pain:  Vitals:   11/25/23 1325  TempSrc:   PainSc: 6                  Deleon Passe L Joziyah Roblero

## 2023-11-25 NOTE — Transfer of Care (Signed)
Immediate Anesthesia Transfer of Care Note  Patient: Scott Schaefer  Procedure(s) Performed: ANTERIOR CERVICAL DECOMPRESSION/DISCECTOMY FUSION 1 LEVEL C3-4 (Spine Cervical)  Patient Location: PACU  Anesthesia Type:General  Level of Consciousness: awake, alert , and oriented  Airway & Oxygen Therapy: Patient Spontanous Breathing and Patient connected to face mask oxygen  Post-op Assessment: Report given to RN and Post -op Vital signs reviewed and stable  Post vital signs: Reviewed and stable  Last Vitals:  Vitals Value Taken Time  BP 166/93 11/25/23 1045  Temp 36.8 C 11/25/23 1045  Pulse 80 11/25/23 1056  Resp 17 11/25/23 1056  SpO2 95 % 11/25/23 1056  Vitals shown include unfiled device data.  Last Pain:  Vitals:   11/25/23 1045  TempSrc:   PainSc: 0-No pain         Complications: No notable events documented.

## 2023-11-25 NOTE — Plan of Care (Signed)
  Problem: Education: Goal: Knowledge of General Education information will improve Description: Including pain rating scale, medication(s)/side effects and non-pharmacologic comfort measures Outcome: Progressing   Problem: Health Behavior/Discharge Planning: Goal: Ability to manage health-related needs will improve Outcome: Progressing   Problem: Clinical Measurements: Goal: Ability to maintain clinical measurements within normal limits will improve Outcome: Progressing Goal: Will remain free from infection Outcome: Progressing Goal: Diagnostic test results will improve Outcome: Progressing Goal: Respiratory complications will improve Outcome: Progressing Goal: Cardiovascular complication will be avoided Outcome: Progressing   Problem: Activity: Goal: Risk for activity intolerance will decrease Outcome: Progressing   Problem: Nutrition: Goal: Adequate nutrition will be maintained Outcome: Progressing   Problem: Coping: Goal: Level of anxiety will decrease Outcome: Progressing   Problem: Elimination: Goal: Will not experience complications related to bowel motility Outcome: Progressing Goal: Will not experience complications related to urinary retention Outcome: Progressing   Problem: Pain Management: Goal: General experience of comfort will improve Outcome: Progressing   Problem: Safety: Goal: Ability to remain free from injury will improve Outcome: Progressing   Problem: Skin Integrity: Goal: Risk for impaired skin integrity will decrease Outcome: Progressing   Problem: Education: Goal: Ability to describe self-care measures that may prevent or decrease complications (Diabetes Survival Skills Education) will improve Outcome: Progressing Goal: Individualized Educational Video(s) Outcome: Progressing   Problem: Coping: Goal: Ability to adjust to condition or change in health will improve Outcome: Progressing   Problem: Fluid Volume: Goal: Ability to  maintain a balanced intake and output will improve Outcome: Progressing   Problem: Health Behavior/Discharge Planning: Goal: Ability to identify and utilize available resources and services will improve Outcome: Progressing Goal: Ability to manage health-related needs will improve Outcome: Progressing   Problem: Metabolic: Goal: Ability to maintain appropriate glucose levels will improve Outcome: Progressing   Problem: Nutritional: Goal: Maintenance of adequate nutrition will improve Outcome: Progressing Goal: Progress toward achieving an optimal weight will improve Outcome: Progressing   Problem: Skin Integrity: Goal: Risk for impaired skin integrity will decrease Outcome: Progressing   Problem: Tissue Perfusion: Goal: Adequacy of tissue perfusion will improve Outcome: Progressing   Problem: Education: Goal: Ability to verbalize activity precautions or restrictions will improve Outcome: Progressing Goal: Knowledge of the prescribed therapeutic regimen will improve Outcome: Progressing Goal: Understanding of discharge needs will improve Outcome: Progressing   Problem: Activity: Goal: Ability to avoid complications of mobility impairment will improve Outcome: Progressing Goal: Ability to tolerate increased activity will improve Outcome: Progressing Goal: Will remain free from falls Outcome: Progressing   Problem: Bowel/Gastric: Goal: Gastrointestinal status for postoperative course will improve Outcome: Progressing   Problem: Clinical Measurements: Goal: Ability to maintain clinical measurements within normal limits will improve Outcome: Progressing Goal: Postoperative complications will be avoided or minimized Outcome: Progressing Goal: Diagnostic test results will improve Outcome: Progressing   Problem: Pain Management: Goal: Pain level will decrease Outcome: Progressing   Problem: Skin Integrity: Goal: Will show signs of wound healing Outcome:  Progressing   Problem: Health Behavior/Discharge Planning: Goal: Identification of resources available to assist in meeting health care needs will improve Outcome: Progressing   Problem: Bladder/Genitourinary: Goal: Urinary functional status for postoperative course will improve Outcome: Progressing

## 2023-11-25 NOTE — Anesthesia Procedure Notes (Signed)
Procedure Name: Intubation Date/Time: 11/25/2023 7:59 AM  Performed by: Thomasene Ripple, CRNAPre-anesthesia Checklist: Patient identified, Emergency Drugs available, Suction available and Patient being monitored Patient Re-evaluated:Patient Re-evaluated prior to induction Oxygen Delivery Method: Circle System Utilized Preoxygenation: Pre-oxygenation with 100% oxygen Induction Type: IV induction Ventilation: Mask ventilation without difficulty Laryngoscope Size: Glidescope and 4 Grade View: Grade I Tube type: Oral Tube size: 8.0 mm Number of attempts: 1 Airway Equipment and Method: Stylet and Oral airway Placement Confirmation: ETT inserted through vocal cords under direct vision, positive ETCO2 and breath sounds checked- equal and bilateral Secured at: 23 cm Tube secured with: Tape Dental Injury: Teeth and Oropharynx as per pre-operative assessment  Comments: Head and neck remained in neutral position

## 2023-11-26 ENCOUNTER — Encounter (HOSPITAL_COMMUNITY): Payer: Self-pay | Admitting: Orthopedic Surgery

## 2023-11-26 DIAGNOSIS — M4802 Spinal stenosis, cervical region: Secondary | ICD-10-CM | POA: Diagnosis not present

## 2023-11-26 LAB — GLUCOSE, CAPILLARY: Glucose-Capillary: 118 mg/dL — ABNORMAL HIGH (ref 70–99)

## 2023-11-26 MED ORDER — PANTOPRAZOLE SODIUM 40 MG PO TBEC
40.0000 mg | DELAYED_RELEASE_TABLET | Freq: Once | ORAL | Status: AC
  Administered 2023-11-26: 40 mg via ORAL
  Filled 2023-11-26: qty 1

## 2023-11-26 MED FILL — Thrombin For Soln Kit 20000 Unit: CUTANEOUS | Qty: 1 | Status: AC

## 2023-11-26 NOTE — Evaluation (Signed)
Occupational Therapy Evaluation Patient Details Name: Scott Schaefer MRN: [id number] DOB: 1961-05-14 Today's Date: 11/26/2023   History of Present Illness Scott Schaefer is a 62 yo male who underwent ACDF C3-4 12/9. PMHx: anxiety, arthritis, depression, DM, GERD, HLD, HTN, sleep apnea   Clinical Impression   Scott Schaefer was evaluated s/p the above spine surgery. He lives with family, works and is indep at baseline. Upon evaluation pt was limited by throat pain with difficulty swallowing, surgical pain, cervical precautions, activity tolerance, anxiety and generalized weakness. Overall he needed generalized superivsion A for all mobility and ADLs with quad cane. Provided cues and education on spinal precautions and compensatory techniques throughout, handout provided and pt demonstrated fair recall during ADLs and mobility. Pt does not require further acute OT services. Recommend d/c home with support of family.         If plan is discharge home, recommend the following: A little help with walking and/or transfers;A little help with bathing/dressing/bathroom;Assistance with cooking/housework;Direct supervision/assist for medications management;Direct supervision/assist for financial management;Assist for transportation;Help with stairs or ramp for entrance    Functional Status Assessment  Patient has had a recent decline in their functional status and demonstrates the ability to make significant improvements in function in a reasonable and predictable amount of time.  Equipment Recommendations  BSC/3in1       Precautions / Restrictions Precautions Precautions: Fall;Cervical Precaution Booklet Issued: Yes (comment) Required Braces or Orthoses: Cervical Brace Cervical Brace: Hard collar;At all times Restrictions Weight Bearing Restrictions: No      Mobility Bed Mobility Overal bed mobility: Needs Assistance Bed Mobility: Rolling, Sidelying to Sit, Sit to Sidelying Rolling:  Supervision Sidelying to sit: Supervision     Sit to sidelying: Supervision General bed mobility comments: HOB elevated, cues for log roll    Transfers Overall transfer level: Needs assistance Equipment used: Quad cane Transfers: Sit to/from Stand Sit to Stand: Supervision                  Balance Overall balance assessment: No apparent balance deficits (not formally assessed)               ADL either performed or assessed with clinical judgement   ADL Overall ADL's : Needs assistance/impaired Eating/Feeding: Independent   Grooming: Supervision/safety   Upper Body Bathing: Set up;Sitting   Lower Body Bathing: Supervison/ safety;Sit to/from stand   Upper Body Dressing : Set up;Sitting Upper Body Dressing Details (indicate cue type and reason): cues for brace don/doff Lower Body Dressing: Supervision/safety;Sit to/from stand   Toilet Transfer: Supervision/safety;Ambulation   Toileting- Clothing Manipulation and Hygiene: Supervision/safety       Functional mobility during ADLs: Supervision/safety General ADL Comments: no physical assist required, needed cues for compensatory techniques and safety     Vision Baseline Vision/History: 1 Wears glasses Vision Assessment?: No apparent visual deficits     Perception Perception: Within Functional Limits       Praxis Praxis: WFL       Pertinent Vitals/Pain Pain Assessment Pain Assessment: Faces Faces Pain Scale: Hurts even more Pain Location: throat Pain Descriptors / Indicators: Discomfort Pain Intervention(s): Limited activity within patient's tolerance, Monitored during session     Extremity/Trunk Assessment Upper Extremity Assessment Upper Extremity Assessment: Overall WFL for tasks assessed   Lower Extremity Assessment Lower Extremity Assessment: Generalized weakness   Cervical / Trunk Assessment Cervical / Trunk Assessment: Neck Surgery   Communication Communication Communication: No  apparent difficulties   Cognition Arousal: Alert Behavior During Therapy: Anxious Overall  Cognitive Status: Within Functional Limits for tasks assessed                                 General Comments: pt reports feeling very anxious in regard to his throat pain and swalloing difficulties     General Comments  VSS. pt concerned about his swallowing difficulties     Home Living Family/patient expects to be discharged to:: Private residence Living Arrangements: Spouse/significant other Available Help at Discharge: Family;Available 24 hours/day Type of Home: House Home Access: Stairs to enter [employer] of Steps: 2   Home Layout: One level     Bathroom Shower/Tub: Chief Strategy Officer: Standard     Home Equipment: Cane - quad          Prior Functioning/Environment Prior Level of Function : Independent/Modified Independent;Driving;Working/employed             Mobility Comments: quad cane intermittently ADLs Comments: indep, works in International aid/development worker Problem List: Decreased strength;Decreased range of motion;Decreased activity tolerance;Decreased safety awareness;Decreased knowledge of use of DME or AE;Decreased knowledge of precautions         OT Goals(Current goals can be found in the care plan section) Acute Rehab OT Goals Patient Stated Goal: less pain OT Goal Formulation: With patient Time For Goal Achievement: 12/10/23 Potential to Achieve Goals: Good   AM-PAC OT "6 Clicks" Daily Activity     Outcome Measure Help from another person eating meals?: None Help from another person taking care of personal grooming?: A Little Help from another person toileting, which includes using toliet, bedpan, or urinal?: A Little Help from another person bathing (including washing, rinsing, drying)?: A Little Help from another person to put on and taking off regular upper body clothing?: A Little Help from another  person to put on and taking off regular lower body clothing?: A Little 6 Click Score: 19   End of Session Equipment Utilized During Treatment: Cervical collar Nurse Communication: Mobility status  Activity Tolerance: Patient tolerated treatment well Patient left: in bed;with call bell/phone within reach  OT Visit Diagnosis: Unsteadiness on feet (R26.81);Other abnormalities of gait and mobility (R26.89);Muscle weakness (generalized) (M62.81);Pain                Time: 1324-4010 OT Time Calculation (min): 25 min Charges:  OT General Charges $OT Visit: 1 Visit OT Evaluation $OT Eval Moderate Complexity: 1 Mod OT Treatments $Self Care/Home Management : 8-22 mins  Derenda Mis, OTR/L Acute Rehabilitation Services Office [phone] Secure Chat Communication Preferred   Donia Pounds 11/26/2023, 9:38 AM

## 2023-11-26 NOTE — Plan of Care (Signed)
Problem: Education: Goal: Knowledge of General Education information will improve Description: Including pain rating scale, medication(s)/side effects and non-pharmacologic comfort measures 11/26/2023 0743 by Vincente Liberty, RN Outcome: Completed/Met 11/25/2023 1837 by Vincente Liberty, RN Outcome: Progressing   Problem: Health Behavior/Discharge Planning: Goal: Ability to manage health-related needs will improve 11/26/2023 0743 by Vincente Liberty, RN Outcome: Completed/Met 11/25/2023 1837 by Vincente Liberty, RN Outcome: Progressing   Problem: Clinical Measurements: Goal: Ability to maintain clinical measurements within normal limits will improve 11/26/2023 0743 by Vincente Liberty, RN Outcome: Completed/Met 11/25/2023 1837 by Vincente Liberty, RN Outcome: Progressing Goal: Will remain free from infection 11/26/2023 0743 by Vincente Liberty, RN Outcome: Completed/Met 11/25/2023 1837 by Vincente Liberty, RN Outcome: Progressing Goal: Diagnostic test results will improve 11/26/2023 0743 by Vincente Liberty, RN Outcome: Completed/Met 11/25/2023 1837 by Vincente Liberty, RN Outcome: Progressing Goal: Respiratory complications will improve 11/26/2023 0743 by Vincente Liberty, RN Outcome: Completed/Met 11/25/2023 1837 by Vincente Liberty, RN Outcome: Progressing Goal: Cardiovascular complication will be avoided 11/26/2023 0743 by Vincente Liberty, RN Outcome: Completed/Met 11/25/2023 1837 by Vincente Liberty, RN Outcome: Progressing   Problem: Activity: Goal: Risk for activity intolerance will decrease 11/26/2023 0743 by Vincente Liberty, RN Outcome: Completed/Met 11/25/2023 1837 by Vincente Liberty, RN Outcome: Progressing   Problem: Nutrition: Goal: Adequate nutrition will be maintained 11/26/2023 0743 by Vincente Liberty, RN Outcome: Completed/Met 11/25/2023 1837 by Vincente Liberty, RN Outcome: Progressing   Problem: Coping: Goal: Level of anxiety will  decrease 11/26/2023 0743 by Vincente Liberty, RN Outcome: Completed/Met 11/25/2023 1837 by Vincente Liberty, RN Outcome: Progressing   Problem: Elimination: Goal: Will not experience complications related to bowel motility 11/26/2023 0743 by Vincente Liberty, RN Outcome: Completed/Met 11/25/2023 1837 by Vincente Liberty, RN Outcome: Progressing Goal: Will not experience complications related to urinary retention 11/26/2023 0743 by Vincente Liberty, RN Outcome: Completed/Met 11/25/2023 1837 by Vincente Liberty, RN Outcome: Progressing   Problem: Pain Management: Goal: General experience of comfort will improve 11/26/2023 0743 by Vincente Liberty, RN Outcome: Completed/Met 11/25/2023 1837 by Vincente Liberty, RN Outcome: Progressing   Problem: Safety: Goal: Ability to remain free from injury will improve 11/26/2023 0743 by Vincente Liberty, RN Outcome: Completed/Met 11/25/2023 1837 by Vincente Liberty, RN Outcome: Progressing   Problem: Skin Integrity: Goal: Risk for impaired skin integrity will decrease 11/26/2023 0743 by Vincente Liberty, RN Outcome: Completed/Met 11/25/2023 1837 by Vincente Liberty, RN Outcome: Progressing   Problem: Education: Goal: Ability to describe self-care measures that may prevent or decrease complications (Diabetes Survival Skills Education) will improve 11/26/2023 0743 by Vincente Liberty, RN Outcome: Completed/Met 11/25/2023 1837 by Vincente Liberty, RN Outcome: Progressing Goal: Individualized Educational Video(s) 11/26/2023 0743 by Vincente Liberty, RN Outcome: Completed/Met 11/25/2023 1837 by Vincente Liberty, RN Outcome: Progressing   Problem: Coping: Goal: Ability to adjust to condition or change in health will improve 11/26/2023 0743 by Vincente Liberty, RN Outcome: Completed/Met 11/25/2023 1837 by Vincente Liberty, RN Outcome: Progressing   Problem: Fluid Volume: Goal: Ability to maintain a balanced intake and output will  improve 11/26/2023 0743 by Vincente Liberty, RN Outcome: Completed/Met 11/25/2023 1837 by Vincente Liberty, RN Outcome: Progressing   Problem: Health Behavior/Discharge Planning: Goal: Ability to identify and utilize available resources and services will improve 11/26/2023 0743 by Vincente Liberty, RN Outcome: Completed/Met 11/25/2023 1837 by Vincente Liberty, RN Outcome: Progressing Goal: Ability to manage health-related needs will improve 11/26/2023 0743 by Vincente Liberty, RN Outcome: Completed/Met 11/25/2023 1837 by Vincente Liberty, RN Outcome: Progressing   Problem: Metabolic: Goal: Ability to maintain appropriate glucose levels will improve  11/26/2023 0743 by Vincente Liberty, RN Outcome: Completed/Met 11/25/2023 1837 by Vincente Liberty, RN Outcome: Progressing   Problem: Nutritional: Goal: Maintenance of adequate nutrition will improve 11/26/2023 0743 by Vincente Liberty, RN Outcome: Completed/Met 11/25/2023 1837 by Vincente Liberty, RN Outcome: Progressing Goal: Progress toward achieving an optimal weight will improve 11/26/2023 0743 by Vincente Liberty, RN Outcome: Completed/Met 11/25/2023 1837 by Vincente Liberty, RN Outcome: Progressing   Problem: Skin Integrity: Goal: Risk for impaired skin integrity will decrease 11/26/2023 0743 by Vincente Liberty, RN Outcome: Completed/Met 11/25/2023 1837 by Vincente Liberty, RN Outcome: Progressing   Problem: Tissue Perfusion: Goal: Adequacy of tissue perfusion will improve 11/26/2023 0743 by Vincente Liberty, RN Outcome: Completed/Met 11/25/2023 1837 by Vincente Liberty, RN Outcome: Progressing   Problem: Education: Goal: Ability to verbalize activity precautions or restrictions will improve 11/26/2023 0743 by Vincente Liberty, RN Outcome: Completed/Met 11/25/2023 1837 by Vincente Liberty, RN Outcome: Progressing Goal: Knowledge of the prescribed therapeutic regimen will improve 11/26/2023 0743 by Vincente Liberty,  RN Outcome: Completed/Met 11/25/2023 1837 by Vincente Liberty, RN Outcome: Progressing Goal: Understanding of discharge needs will improve 11/26/2023 0743 by Vincente Liberty, RN Outcome: Completed/Met 11/25/2023 1837 by Vincente Liberty, RN Outcome: Progressing   Problem: Activity: Goal: Ability to avoid complications of mobility impairment will improve 11/26/2023 0743 by Vincente Liberty, RN Outcome: Completed/Met 11/25/2023 1837 by Vincente Liberty, RN Outcome: Progressing Goal: Ability to tolerate increased activity will improve 11/26/2023 0743 by Vincente Liberty, RN Outcome: Completed/Met 11/25/2023 1837 by Vincente Liberty, RN Outcome: Progressing Goal: Will remain free from falls 11/26/2023 0743 by Vincente Liberty, RN Outcome: Completed/Met 11/25/2023 1837 by Vincente Liberty, RN Outcome: Progressing   Problem: Bowel/Gastric: Goal: Gastrointestinal status for postoperative course will improve 11/26/2023 0743 by Vincente Liberty, RN Outcome: Completed/Met 11/25/2023 1837 by Vincente Liberty, RN Outcome: Progressing   Problem: Clinical Measurements: Goal: Ability to maintain clinical measurements within normal limits will improve 11/26/2023 0743 by Vincente Liberty, RN Outcome: Completed/Met 11/25/2023 1837 by Vincente Liberty, RN Outcome: Progressing Goal: Postoperative complications will be avoided or minimized 11/26/2023 0743 by Vincente Liberty, RN Outcome: Completed/Met 11/25/2023 1837 by Vincente Liberty, RN Outcome: Progressing Goal: Diagnostic test results will improve 11/26/2023 0743 by Vincente Liberty, RN Outcome: Completed/Met 11/25/2023 1837 by Vincente Liberty, RN Outcome: Progressing   Problem: Pain Management: Goal: Pain level will decrease 11/26/2023 0743 by Vincente Liberty, RN Outcome: Completed/Met 11/25/2023 1837 by Vincente Liberty, RN Outcome: Progressing   Problem: Skin Integrity: Goal: Will show signs of wound healing 11/26/2023 0743  by Vincente Liberty, RN Outcome: Completed/Met 11/25/2023 1837 by Vincente Liberty, RN Outcome: Progressing   Problem: Health Behavior/Discharge Planning: Goal: Identification of resources available to assist in meeting health care needs will improve 11/26/2023 0743 by Vincente Liberty, RN Outcome: Completed/Met 11/25/2023 1837 by Vincente Liberty, RN Outcome: Progressing   Problem: Bladder/Genitourinary: Goal: Urinary functional status for postoperative course will improve 11/26/2023 0743 by Vincente Liberty, RN Outcome: Completed/Met 11/25/2023 1837 by Vincente Liberty, RN Outcome: Progressing

## 2023-11-26 NOTE — Progress Notes (Signed)
PT Cancellation Note  Patient Details Name: Scott Schaefer MRN: [id number] DOB: 05-Mar-1961   Cancelled Treatment:    Reason Eval/Treat Not Completed: PT screened, no needs identified, will sign off. Pt with no acute PT needs per OT as pt is mod I and with good understanding of cervical precautions. PT SIGNING OFF.  Lewis Shock, PT, DPT Acute Rehabilitation Services Secure chat preferred Office #: [phone]    [name] 11/26/2023, 9:38 AM

## 2023-11-26 NOTE — Discharge Summary (Signed)
Patient ID: Scott Schaefer MRN: [id number] DOB/AGE: 1961/01/02 62 y.o.  Admit date: 11/25/2023 Discharge date: 11/26/2023  Admission Diagnoses:  Principal Problem:   Cervical myelopathy [facility])   Discharge Diagnoses:  Principal Problem:   Cervical myelopathy (HCC)  status post Procedure(s): ANTERIOR CERVICAL DECOMPRESSION/DISCECTOMY FUSION 1 LEVEL C3-4  Past Medical History:  Diagnosis Date   Anxiety    Arthritis    Depression    Diabetes mellitus    GERD (gastroesophageal reflux disease)    Hyperlipidemia    Hypertension    Sleep apnea    has cpap but does not use    Surgeries: Procedure(s): ANTERIOR CERVICAL DECOMPRESSION/DISCECTOMY FUSION 1 LEVEL C3-4 on 11/25/2023   Consultants:   Discharged Condition: Improved  Hospital Course: Scott Schaefer is an 62 y.o. male who was admitted 11/25/2023 for operative treatment of Cervical myelopathy (HCC). Patient failed conservative treatments (please see the history and physical for the specifics) and had severe unremitting pain that affects sleep, daily activities and work/hobbies. After pre-op clearance, the patient was taken to the operating room on 11/25/2023 and underwent  Procedure(s): ANTERIOR CERVICAL DECOMPRESSION/DISCECTOMY FUSION 1 LEVEL C3-4.    Patient was given perioperative antibiotics:  Anti-infectives (From admission, onward)    Start     Dose/Rate Route Frequency Ordered Stop   11/25/23 1600  ceFAZolin (ANCEF) IVPB 1 g/50 mL premix        1 g 100 mL/hr over 30 Minutes Intravenous Every 8 hours 11/25/23 1119 11/26/23 0013   11/25/23 0607  ceFAZolin (ANCEF) 2-4 GM/100ML-% IVPB       Note to Pharmacy: Samuella Cota O: cabinet override      11/25/23 0607 11/25/23 0823   11/25/23 0604  ceFAZolin (ANCEF) IVPB 2g/100 mL premix        2 g 200 mL/hr over 30 Minutes Intravenous 30 min pre-op 11/25/23 0604 11/25/23 0810        Patient was given sequential compression devices and early ambulation to prevent  DVT.   Patient benefited maximally from hospital stay and there were no complications. At the time of discharge, the patient was urinating/moving their bowels without difficulty, tolerating a regular diet, pain is controlled with oral pain medications and they have been cleared by PT/OT.   Recent vital signs: Patient Vitals for the past 24 hrs:  BP Temp Temp src Pulse Resp SpO2  11/26/23 0337 (!) 146/87 (!) 97.5 F (36.4 C) Oral 61 18 100 %  11/25/23 2311 (!) 145/91 98.1 F (36.7 C) Oral 69 20 99 %  11/25/23 1927 (!) 168/84 98.2 F (36.8 C) Oral 68 18 99 %  11/25/23 1538 (!) 172/91 98 F (36.7 C) Oral 65 19 100 %  11/25/23 1146 (!) 148/87 97.8 F (36.6 C) -- 72 20 95 %  11/25/23 1130 (!) 159/92 97.7 F (36.5 C) -- 74 16 93 %  11/25/23 1115 (!) 156/89 -- -- 77 17 95 %  11/25/23 1100 (!) 165/96 -- -- 82 15 95 %  11/25/23 1045 (!) 166/93 98.2 F (36.8 C) -- 80 16 99 %     Recent laboratory studies: No results for input(s): "WBC", "HGB", "HCT", "PLT", "NA", "K", "CL", "CO2", "BUN", "CREATININE", "GLUCOSE", "INR", "CALCIUM" in the last 72 hours.  Invalid input(s): "PT", "2"   Discharge Medications:   Allergies as of 11/26/2023       Reactions   Lisinopril Anaphylaxis   Ace Inhibitors Other (See Comments)   Angioedema - history of angioedema in the past, history  of ACE inhibitor use, questionable confounding factor with consumption of fish        Medication List     STOP taking these medications    sildenafil 100 MG tablet Commonly known as: VIAGRA       TAKE these medications    Accu-Chek FastClix Lancets Misc Apply topically.   ALPRAZolam 1 MG tablet Commonly known as: XANAX Take 1 mg by mouth 3 (three) times daily as needed for anxiety.   amLODipine 10 MG tablet Commonly known as: NORVASC Take 10 mg by mouth daily.   atorvastatin 20 MG tablet Commonly known as: LIPITOR Take 20 mg by mouth daily.   glipiZIDE 2.5 MG 24 hr tablet Commonly known as:  GLUCOTROL XL Take 2.5 mg by mouth daily with breakfast.   hydrochlorothiazide 25 MG tablet Commonly known as: HYDRODIURIL Take 1 tablet (25 mg total) by mouth daily.   metFORMIN 1000 MG tablet Commonly known as: GLUCOPHAGE Take 1,000 mg by mouth 2 (two) times daily with a meal.   methocarbamol 500 MG tablet Commonly known as: ROBAXIN Take 1 tablet (500 mg total) by mouth every 8 (eight) hours as needed for up to 5 days for muscle spasms.   metoprolol succinate 25 MG 24 hr tablet Commonly known as: TOPROL-XL Take 25 mg by mouth daily.   ondansetron 4 MG tablet Commonly known as: Zofran Take 1 tablet (4 mg total) by mouth every 8 (eight) hours as needed for nausea or vomiting.   oxyCODONE-acetaminophen 10-325 MG tablet Commonly known as: Percocet Take 1 tablet by mouth every 6 (six) hours as needed for up to 5 days for pain.   PARoxetine 20 MG tablet Commonly known as: PAXIL Take 20 mg by mouth every morning.   Trulicity 1.5 MG/0.5ML Soaj Generic drug: Dulaglutide Inject 1.5 mg into the skin every 7 (seven) days.        Diagnostic Studies: DG Cervical Spine 2 or 3 views  Result Date: 11/25/2023 CLINICAL DATA:  Elective surgery EXAM: Intraoperative fluoroscopy COMPARISON:  None Available. FINDINGS: Three fluoroscopic spot images submitted for review demonstrate progressive placement of prosthetic disc material at the C3-4 level ET tube in place. Imaging was obtained to aid in treatment. Please correlate with real-time fluoroscopy of 1 minute and 2 seconds with a cumulative dose of 8.62 mGy. IMPRESSION: Intraoperative fluoroscopy Electronically Signed   By: Karen Kays M.D.   On: 11/25/2023 14:06   DG C-Arm 1-60 Min-No Report  Result Date: 11/25/2023 Fluoroscopy was utilized by the requesting physician.  No radiographic interpretation.   DG C-Arm 1-60 Min-No Report  Result Date: 11/25/2023 Fluoroscopy was utilized by the requesting physician.  No radiographic  interpretation.    Discharge Instructions     Incentive spirometry RT   Complete by: As directed          Discharge Plan:  discharge to home  Disposition: Scott Schaefer is a 62 year old gentleman with cervical spondylitic myelopathy who underwent an ACDF C3-4.  This morning he notes some increased anxiety due to pain and perceived dysphagia.  Incision site is clean dry and intact.  There is no significant swelling or excessive tenderness with palpation.  Patient has been restarted on all his anti-anxiolytic medications.  Patient has been ambulating with improved gait pattern and balance.  Upper extremity dysesthesias are improving.  Patient is tolerating a regular diet.  He is voiding spontaneously and has had positive flatus.  Patiently discharged to home with appropriate instructions and medications.  He will follow-up  with me in 2 weeks for wound check.  All of his questions were addressed.    Signed: Alvy Beal for Dr. Venita Lick [facility] [phone] 11/26/2023, 7:48 AM

## 2023-12-24 ENCOUNTER — Ambulatory Visit
Admission: EM | Admit: 2023-12-24 | Discharge: 2023-12-24 | Disposition: A | Discharge status: Home / Self Care | Payer: 59 | Attending: Family Medicine | Admitting: Family Medicine

## 2023-12-24 DIAGNOSIS — L0231 Cutaneous abscess of buttock: Secondary | ICD-10-CM

## 2023-12-24 DIAGNOSIS — L03317 Cellulitis of buttock: Secondary | ICD-10-CM

## 2023-12-24 MED ORDER — DOXYCYCLINE HYCLATE 100 MG PO CAPS: 100.0000 mg | ORAL_CAPSULE | Freq: Two times a day (BID) | ORAL | 0 refills | Status: AC

## 2023-12-24 NOTE — ED Triage Notes (Signed)
 Abscess that he noticed around the but area on sunday

## 2023-12-24 NOTE — ED Provider Notes (Signed)
 [facility]    CSN: [id number] Arrival date & time: 12/24/23  1221      History   Chief Complaint Chief Complaint  Patient presents with   Abscess    HPI Scott Schaefer is a 63 y.o. male, recurrent abscess and type 2 diabetes.   Abscess Patient presents today with left gluteal abscess 3 days.  Has a history of recurrent pilonidal cyst.  Patient is incontinent and wears briefs and reports he is unaware if the abscess has been draining.  Patient has had not had a fever, nausea or vomiting  Past Medical History:  Diagnosis Date   Anxiety    Arthritis    Depression    Diabetes mellitus    GERD (gastroesophageal reflux disease)    Hyperlipidemia    Hypertension    Sleep apnea    has cpap but does not use    Patient Active Problem List   Diagnosis Date Noted   Cervical myelopathy (HCC) 11/25/2023   Elevated blood pressure reading 01/28/2018   Angioedema 10/16/2015   Anxiety    Type 2 diabetes mellitus without complication, without long-term current use of insulin  (HCC)    Essential hypertension    HYPERLIPIDEMIA 05/20/2009   OBSTRUCTIVE SLEEP APNEA 05/20/2009   Essential hypertension 05/20/2009    Past Surgical History:  Procedure Laterality Date   ANTERIOR CERVICAL DECOMP/DISCECTOMY FUSION N/A 11/25/2023   Procedure: ANTERIOR CERVICAL DECOMPRESSION/DISCECTOMY FUSION 1 LEVEL C3-4;  Surgeon: Burnetta Aures, MD;  Location: [facility] OR;  Service: Orthopedics;  Laterality: N/A;  3 hrs 3 C-Bed   COLONOSCOPY  2023   2023       Home Medications    Prior to Admission medications   Medication Sig Start Date End Date Taking? Authorizing Provider  ALPRAZolam  (XANAX ) 1 MG tablet Take 1 mg by mouth 3 (three) times daily as needed for anxiety.  02/03/12  Yes [provider]  amLODipine  (NORVASC ) 10 MG tablet Take 10 mg by mouth daily. 07/31/16  Yes [provider]  atorvastatin  (LIPITOR) 20 MG tablet Take 20 mg by mouth daily. 10/07/23  Yes  [provider]  doxycycline  (VIBRAMYCIN ) 100 MG capsule Take 1 capsule (100 mg total) by mouth 2 (two) times daily for 14 days. 12/24/23 01/07/24 Yes Arloa Suzen RAMAN, NP  glipiZIDE  (GLUCOTROL  XL) 2.5 MG 24 hr tablet Take 2.5 mg by mouth daily with breakfast. 07/17/22  Yes [provider]  hydrochlorothiazide  (HYDRODIURIL ) 25 MG tablet Take 1 tablet (25 mg total) by mouth daily. 10/17/15  Yes Rai, Ripudeep K, MD  metFORMIN  (GLUCOPHAGE ) 1000 MG tablet Take 1,000 mg by mouth 2 (two) times daily with a meal.   Yes [provider]  metoprolol  succinate (TOPROL -XL) 25 MG 24 hr tablet Take 25 mg by mouth daily. 06/14/22  Yes [provider]  PARoxetine  (PAXIL ) 20 MG tablet Take 20 mg by mouth every morning.  01/29/12  Yes [provider]  TRULICITY 1.5 MG/0.5ML SOPN Inject 1.5 mg into the skin every 7 (seven) days. 07/18/22  Yes [provider]  Accu-Chek FastClix Lancets MISC Apply topically. 07/17/22   [provider]  ondansetron  (ZOFRAN ) 4 MG tablet Take 1 tablet (4 mg total) by mouth every 8 (eight) hours as needed for nausea or vomiting. 11/25/23   Burnetta Aures, MD    Family History Family History  Problem Relation Age of Onset   Colon polyps Sister    Hypertension Sister    Hypertension Brother    Diabetes  Maternal Grandmother    Colon cancer Neg Hx    Stomach cancer Neg Hx    Esophageal cancer Neg Hx    Rectal cancer Neg Hx     Social History Social History   Tobacco Use   Smoking status: Every Day    Current packs/day: 0.80    Types: Cigarettes   Smokeless tobacco: Never  Vaping Use   Vaping status: Never Used  Substance Use Topics   Alcohol use: Yes    Alcohol/week: 3.0 - 4.0 standard drinks of alcohol    Types: 3 - 4 Cans of beer per week    Comment: weekends only   Drug use: No     Allergies   Lisinopril and Ace inhibitors   Review of Systems Review of Systems Pertinent negatives listed in HPI   Physical  Exam Triage Vital Signs ED Triage Vitals  Encounter Vitals Group     BP 12/24/23 1340 (!) 147/92     Systolic BP Percentile --      Diastolic BP Percentile --      Pulse Rate 12/24/23 1340 72     Resp 12/24/23 1340 17     Temp 12/24/23 1340 98.6 F (37 C)     Temp Source 12/24/23 1340 Oral     SpO2 12/24/23 1340 97 %     Weight --      Height --      Head Circumference --      Peak Flow --      Pain Score 12/24/23 1339 9     Pain Loc --      Pain Education --      Exclude from Growth Chart --    No data found.  Updated Vital Signs BP (!) 147/92 (BP Location: Left Arm)   Pulse 72   Temp 98.6 F (37 C) (Oral)   Resp 17   SpO2 97%   Visual Acuity Right Eye Distance:   Left Eye Distance:   Bilateral Distance:    Right Eye Near:   Left Eye Near:    Bilateral Near:     Physical Exam Vitals reviewed.  Constitutional:      Appearance: Normal appearance.  HENT:     Head: Normocephalic and atraumatic.     Nose: Nose normal.  Eyes:     Extraocular Movements: Extraocular movements intact.     Conjunctiva/sclera: Conjunctivae normal.     Pupils: Pupils are equal, round, and reactive to light.  Cardiovascular:     Rate and Rhythm: Normal rate and regular rhythm.  Pulmonary:     Effort: Pulmonary effort is normal.     Breath sounds: Normal breath sounds.  Musculoskeletal:        General: Normal range of motion.  Skin:    General: Skin is warm and dry.  Neurological:     General: No focal deficit present.     Mental Status: He is alert.      UC Treatments / Results  Labs (all labs ordered are listed, but only abnormal results are displayed) Labs Reviewed - No data to display  EKG   Radiology No results found.  Procedures Incision and Drainage  Date/Time: 12/24/2023 4:06 PM  Performed by: Arloa Suzen RAMAN, NP Authorized by: Arloa Suzen RAMAN, NP   Consent:    Consent obtained:  Verbal   Consent given by:  Patient   Risks discussed:  Incomplete  drainage and bleeding   Alternatives discussed:  No treatment  Universal protocol:    Procedure explained and questions answered to patient or proxy's satisfaction: yes     Patient identity confirmed:  Verbally with patient Location:    Type:  Abscess   Location:  Anogenital   Anogenital location:  Perirectal Pre-procedure details:    Skin preparation:  Antiseptic wash Sedation:    Sedation type:  None Anesthesia:    Anesthesia method:  Local infiltration   Local anesthetic:  Lidocaine  1% WITH epi Procedure type:    Complexity:  Complex Procedure details:    Ultrasound guidance: no     Needle aspiration: no     Incision types:  Stab incision   Incision depth:  Submucosal   Drainage:  Purulent   Drainage amount:  Copious Post-procedure details:    Procedure completion:  Tolerated  (including critical care time)  Medications Ordered in UC Medications - No data to display  Initial Impression / Assessment and Plan / UC Course  I have reviewed the triage vital signs and the nursing notes.  Pertinent labs & imaging results that were available during my care of the patient were reviewed by me and considered in my medical decision making (see chart for details).     Final Clinical Impressions(s) / UC Diagnoses   Final diagnoses:  Abscess, gluteal, left  Cellulitis of buttock     Discharge Instructions      Keep area covered for the next 8 hours.  After 8 hours bathe normally area with soap and water.   Apply pad to buttocks until draining resolves. Complete entire course of antibiotics.  If you experience any fever, worsening pain, return for reevaluation.   ED Prescriptions     Medication Sig Dispense Auth. Provider   doxycycline  (VIBRAMYCIN ) 100 MG capsule Take 1 capsule (100 mg total) by mouth 2 (two) times daily for 14 days. 28 capsule Arloa Suzen RAMAN, NP      PDMP not reviewed this encounter.   Arloa Suzen RAMAN, NP 12/24/23 [phone]

## 2023-12-24 NOTE — Discharge Instructions (Addendum)
 Keep area covered for the next 8 hours.  After 8 hours bathe normally area with soap and water.   Apply pad to buttocks until draining resolves. Complete entire course of antibiotics.  If you experience any fever, worsening pain, return for reevaluation

## 2024-04-09 ENCOUNTER — Other Ambulatory Visit: Payer: Self-pay | Admitting: Urology

## 2024-04-29 NOTE — Progress Notes (Addendum)
 Anesthesia Review:  PCP: [place]  Annabell Key, PA  at [facility] [place]  Cardiologist : none   PPM/ ICD: Device Orders: Rep Notified:  Chest x-ray : EKG : 11/21/23  Echo : Stress test: Cardiac Cath :   Activity level: can do a flight of stairs without difficulty  Sleep Study/ CPAP : has sleep apnea does not use cpap  Fasting Blood Sugar :      / Checks Blood Sugar -- times a day:     DM- type 2- checks glucose daily in am  Hgba1c-  05/05/2024- 6.5  Gluctrol- none am of procedure Metformin - none am of surgery  Trulicity - last dose on  04/27/2024   Blood Thinner/ Instructions /Last Dose: ASA / Instructions/ Last Dose :    11/25/23- ACDF    Smoker- pt aware no smoking after midnite

## 2024-04-29 NOTE — Patient Instructions (Signed)
 SURGICAL WAITING ROOM VISITATION  Patients having surgery or a procedure may have no more than 2 support people in the waiting area - these visitors may rotate.    Children under the age of 84 must have an adult with them who is not the patient.   Visitors with respiratory illnesses are discouraged from visiting and should remain at home.  If the patient needs to stay at the hospital during part of their recovery, the visitor guidelines for inpatient rooms apply. Pre-op nurse will coordinate an appropriate time for 1 support person to accompany patient in pre-op.  This support person may not rotate.    Please refer to the [facility] website for the visitor guidelines for Inpatients (after your surgery is over and you are in a regular room).       Your procedure is scheduled on:  05/07/2024    Report to [facility] Main Entrance    Report to admitting at  0900 AM   Call this number if you have problems the morning of surgery [phone]   Do not eat food  or drink liquids :After Midnight.                  If you have questions, please contact your surgeon's office.       Oral Hygiene is also important to reduce your risk of infection.                                    Remember - BRUSH YOUR TEETH THE MORNING OF SURGERY WITH YOUR REGULAR TOOTHPASTE  DENTURES WILL BE REMOVED PRIOR TO SURGERY PLEASE DO NOT APPLY "Poly grip" OR ADHESIVES!!!   Do NOT smoke after Midnight   Stop all vitamins and herbal supplements 7 days before surgery.   Take these medicines the morning of surgery with A SIP OF WATER:  amlodipoine, toprol , paxil              Glucotrol - none am of surgery            Metformin - none am of surgery            Trulicity - last dose on   DO NOT TAKE ANY ORAL DIABETIC MEDICATIONS DAY OF YOUR SURGERY  Bring CPAP mask and tubing day of surgery.                              You may not have any metal on your body including hair pins, jewelry, and body  piercing             Do not wear make-up, lotions, powders, perfumes/cologne, or deodorant  Do not wear nail polish including gel and S&S, artificial/acrylic nails, or any other type of covering on natural nails including finger and toenails. If you have artificial nails, gel coating, etc. that needs to be removed by a nail salon please have this removed prior to surgery or surgery may need to be canceled/ delayed if the surgeon/ anesthesia feels like they are unable to be safely monitored.   Do not shave  48 hours prior to surgery.               Men may shave face and neck.   Do not bring valuables to the hospital. [facility] IS NOT  RESPONSIBLE   FOR VALUABLES.   Contacts, glasses, dentures or bridgework may not be worn into surgery.   Bring small overnight bag day of surgery.   DO NOT BRING YOUR HOME MEDICATIONS TO THE HOSPITAL. PHARMACY WILL DISPENSE MEDICATIONS LISTED ON YOUR MEDICATION LIST TO YOU DURING YOUR ADMISSION IN THE HOSPITAL!    Patients discharged on the day of surgery will not be allowed to drive home.  Someone NEEDS to stay with you for the first 24 hours after anesthesia.   Special Instructions: Bring a copy of your healthcare power of attorney and living will documents the day of surgery if you haven't scanned them before.              Please read over the following fact sheets you were given: IF YOU HAVE QUESTIONS ABOUT YOUR PRE-OP INSTRUCTIONS PLEASE CALL [phone]   If you received a COVID test during your pre-op visit  it is requested that you wear a mask when out in public, stay away from anyone that may not be feeling well and notify your surgeon if you develop symptoms. If you test positive for Covid or have been in contact with anyone that has tested positive in the last 10 days please notify you surgeon.    [facility] - Preparing for Surgery Before surgery, you can play an important role.  Because skin is not sterile, your skin needs to be  as free of germs as possible.  You can reduce the number of germs on your skin by washing with CHG (chlorahexidine gluconate) soap before surgery.  CHG is an antiseptic cleaner which kills germs and bonds with the skin to continue killing germs even after washing. Please DO NOT use if you have an allergy to CHG or antibacterial soaps.  If your skin becomes reddened/irritated stop using the CHG and inform your nurse when you arrive at Short Stay. Do not shave (including legs and underarms) for at least 48 hours prior to the first CHG shower.  You may shave your face/neck. Please follow these instructions carefully:  1.  Shower with CHG Soap the night before surgery and the  morning of Surgery.  2.  If you choose to wash your hair, wash your hair first as usual with your  normal  shampoo.  3.  After you shampoo, rinse your hair and body thoroughly to remove the  shampoo.                           4.  Use CHG as you would any other liquid soap.  You can apply chg directly  to the skin and wash                       Gently with a scrungie or clean washcloth.  5.  Apply the CHG Soap to your body ONLY FROM THE NECK DOWN.   Do not use on face/ open                           Wound or open sores. Avoid contact with eyes, ears mouth and genitals (private parts).                       Wash face,  Genitals (private parts) with your normal soap.             6.  Wash thoroughly,  paying special attention to the area where your surgery  will be performed.  7.  Thoroughly rinse your body with warm water from the neck down.  8.  DO NOT shower/wash with your normal soap after using and rinsing off  the CHG Soap.                9.  Pat yourself dry with a clean towel.            10.  Wear clean pajamas.            11.  Place clean sheets on your bed the night of your first shower and do not  sleep with pets. Day of Surgery : Do not apply any lotions/deodorants the morning of surgery.  Please wear clean clothes to the  hospital/surgery center.  FAILURE TO FOLLOW THESE INSTRUCTIONS MAY RESULT IN THE CANCELLATION OF YOUR SURGERY PATIENT SIGNATURE_________________________________  NURSE SIGNATURE__________________________________  ________________________________________________________________________

## 2024-05-05 ENCOUNTER — Encounter (HOSPITAL_COMMUNITY)
Admission: RE | Admit: 2024-05-05 | Discharge: 2024-05-05 | Disposition: A | Discharge status: Home / Self Care | Source: Ambulatory Visit | Attending: Urology | Admitting: Urology

## 2024-05-05 ENCOUNTER — Other Ambulatory Visit: Payer: Self-pay

## 2024-05-05 ENCOUNTER — Encounter (HOSPITAL_COMMUNITY): Payer: Self-pay

## 2024-05-05 VITALS — BP 135/88 | HR 63 | Temp 98.9°F | Resp 16 | Ht 74.0 in | Wt 226.0 lb

## 2024-05-05 DIAGNOSIS — Z01812 Encounter for preprocedural laboratory examination: Secondary | ICD-10-CM | POA: Diagnosis not present

## 2024-05-05 DIAGNOSIS — Z01818 Encounter for other preprocedural examination: Secondary | ICD-10-CM | POA: Diagnosis present

## 2024-05-05 LAB — CBC
HCT: 38.9 % — ABNORMAL LOW (ref 39.0–52.0)
Hemoglobin: 13.1 g/dL (ref 13.0–17.0)
MCH: 28.1 pg (ref 26.0–34.0)
MCHC: 33.7 g/dL (ref 30.0–36.0)
MCV: 83.3 fL (ref 80.0–100.0)
Platelets: 245 10*3/uL (ref 150–400)
RBC: 4.67 MIL/uL (ref 4.22–5.81)
RDW: 16.1 % — ABNORMAL HIGH (ref 11.5–15.5)
WBC: 5.2 10*3/uL (ref 4.0–10.5)
nRBC: 0 % (ref 0.0–0.2)

## 2024-05-05 LAB — BASIC METABOLIC PANEL WITH GFR
Anion gap: 10 (ref 5–15)
BUN: 8 mg/dL (ref 8–23)
CO2: 25 mmol/L (ref 22–32)
Calcium: 9.4 mg/dL (ref 8.9–10.3)
Chloride: 101 mmol/L (ref 98–111)
Creatinine, Ser: 0.79 mg/dL (ref 0.61–1.24)
GFR, Estimated: >60 mL/min (ref >60)
Glucose, Bld: 110 mg/dL — ABNORMAL HIGH (ref 70–99)
Potassium: 4.1 mmol/L (ref 3.5–5.1)
Sodium: 136 mmol/L (ref 135–145)

## 2024-05-05 LAB — HEMOGLOBIN A1C
Hgb A1c MFr Bld: 6.5 % — ABNORMAL HIGH (ref 4.8–5.6)
Mean Plasma Glucose: 139.85 mg/dL

## 2024-05-05 LAB — GLUCOSE, CAPILLARY: Glucose-Capillary: 112 mg/dL — ABNORMAL HIGH (ref 70–99)

## 2024-05-06 NOTE — H&P (Signed)
 63 year old male presents emergency escorted for elevated PSAs. As well as lower urinary tract symptoms and ED. PSA at the time of last visitation was normal, he was tried on tolterodine which then lost to follow-up. He returns today for genital warts that have been present for the past year per OSH records there is No pain and STI work up negative.   PMH: Diabetes, HLD, HTN, anxiety, arthiris, depression. NoCards, no blood thinners, Pulm hx negative, current smoker.  PSH: Anterior fusion   Genital warts:  04/08/24: no pain, no issues, he is in monogomous relationship. unclear how long it has been there.  05/07/24: patient presents today for laser removal of genital warts     ALLERGIES: lisinopril - Anaphylaxis (Severe)    MEDICATIONS: metFORMIN  HCl 1000 MG Tablet  ALPRAZolam  1 MG Tablet  Amlodipine  Besylate  hydroCHLOROthiazide  25 MG Tablet 1 tablet PO Daily  PARoxetine  HCl 20 MG Tablet     GU PSH: None   NON-GU PSH: Neck Surgery (Unspecified), cervical fusion - 12/17/2022     GU PMH: BPH w/LUTS - 2018, - 2018 ED due to arterial insufficiency - 2018, - 2018 Elevated PSA - 2018, - 2018 Urinary Urgency - 2018 Urinary Frequency - 2018    NON-GU PMH: Anxiety Arthritis Depression Diabetes Type 2 GERD Gout Hypercholesterolemia Hypertension Sleep Apnea    FAMILY HISTORY: Diabetes - Runs in Family Lung Cancer - Father   SOCIAL HISTORY: Marital Status: Single Preferred Language: English; Ethnicity: Not Hispanic Or Latino; Race: Black or African American Current Smoking Status: Patient smokes.   Tobacco Use Assessment Completed: Used Tobacco in last 30 days? Drinks 3 drinks per week.  Does not use drugs. Drinks 2 caffeinated drinks per day. 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     Notes: 2 sons    REVIEW OF SYSTEMS:    GU Review Male:   Patient reports frequent urination and leakage of urine. Patient denies hard to postpone urination, burning/ pain with urination, get  up at night to urinate, stream starts and stops, trouble starting your stream, have to strain to urinate , erection problems, and penile pain.  Gastrointestinal (Upper):   Patient denies nausea, vomiting, and indigestion/ heartburn.  Gastrointestinal (Lower):   Patient denies diarrhea and constipation.  Constitutional:   Patient denies fever, night sweats, weight loss, and fatigue.  Skin:   Patient reports skin rash/ lesion. Patient denies itching.  Eyes:   Patient denies blurred vision and double vision.  Ears/ Nose/ Throat:   Patient denies sore throat and sinus problems.  Hematologic/Lymphatic:   Patient denies swollen glands and easy bruising.  Cardiovascular:   Patient denies leg swelling and chest pains.  Respiratory:   Patient denies cough and shortness of breath.  Endocrine:   Patient denies excessive thirst.  Musculoskeletal:   Patient reports joint pain. Patient denies back pain.  Neurological:   Patient denies headaches and dizziness.  Psychologic:   Patient reports anxiety. Patient denies depression.   VITAL SIGNS:      04/08/2024 08:12 AM  BP 165/84 mmHg  Pulse 79 /min  Temperature 98.5 F / 36.9 C   GU PHYSICAL EXAMINATION:    Scrotum: sebasious cysts on the bottom of the scrotum. no evidence of scabious cyst.   Penis: circumcised penis, 2 warts on left side of penis, 1cm larged on the more ventral size. left more lateral 05cm.    MULTI-SYSTEM PHYSICAL EXAMINATION:    Constitutional: Well-nourished. No physical deformities. Normally developed. Good grooming.  Respiratory: No  labored breathing, no use of accessory muscles.   Cardiovascular: Normal temperature, normal extremity pulses, no swelling, no varicosities.  Neurologic / Psychiatric: Oriented to time, oriented to place, oriented to person. No depression, no anxiety, no agitation.  Musculoskeletal: Normal gait and station of head and neck.     Complexity of Data:  Source Of History:  Patient  Records Review:    Previous Patient Records  Urine Test Review:   Urinalysis   08/26/17 02/25/17  PSA  Total PSA 1.34 ng/mL 1.43 ng/dl    PROCEDURES:          Visit Complexity - G2211          Urinalysis Dipstick Dipstick Cont'd  Color: Straw Bilirubin: Neg mg/dL  Appearance: Clear Ketones: Neg mg/dL  Specific Gravity: 9.147 Blood: Neg ery/uL  pH: 6.0 Protein: Neg mg/dL  Glucose: Neg mg/dL Urobilinogen: 0.2 mg/dL    Nitrites: Neg    Leukocyte Esterase: Neg leu/uL    ASSESSMENT:      ICD-10 Details  1 GU:   Genital warts - A63.0 Undiagnosed New Problem   PLAN:           Document Letter(s):  Created for Patient: Clinical Summary         Notes:   Genital warts: On the shaft largest wart 1 cm discussed options of imiquimod cream versus CO2 laser ablation.Patient presents today for laser removal of warts.    We discussed risk benefits alternatives to the procedure including bleeding infection damage chronic structures possible scarring possible damage to deeper structures including the corpora and urethra patient voices understanding consent was obtained.   CC: Sherron Dom, PA

## 2024-05-06 NOTE — Anesthesia Preprocedure Evaluation (Addendum)
 Anesthesia Evaluation  Patient identified by MRN, date of birth, ID band Patient awake    Reviewed: Allergy & Precautions, NPO status , Patient's Chart, lab work & pertinent test results, reviewed documented beta blocker date and time   Airway Mallampati: II  TM Distance: >3 FB Neck ROM: Full    Dental  (+) Missing, Dental Advisory Given   Pulmonary sleep apnea (noncompliant with CPAP) , Current Smoker and Patient abstained from smoking.   Pulmonary exam normal breath sounds clear to auscultation       Cardiovascular hypertension, Pt. on medications and Pt. on home beta blockers Normal cardiovascular exam Rhythm:Regular Rate:Normal     Neuro/Psych  PSYCHIATRIC DISORDERS Anxiety Depression    negative neurological ROS     GI/Hepatic Neg liver ROS,GERD  ,,  Endo/Other  diabetes, Type 2, Oral Hypoglycemic Agents    Renal/GU negative Renal ROS  negative genitourinary   Musculoskeletal  (+) Arthritis ,    Abdominal   Peds  Hematology negative hematology ROS (+)   Anesthesia Other Findings 63 yo male has a past medical history of HTN, OSA (noncompliant with CPAP), DM (A1c 6.5), GERD, arthritis, anxiety, depression. Hx of ACDF in 11/2023.   LD Trulicity: 5/12  Reproductive/Obstetrics                             Anesthesia Physical Anesthesia Plan  ASA: 3  Anesthesia Plan: General   Post-op Pain Management: Tylenol  PO (pre-op)*   Induction: Intravenous  PONV Risk Score and Plan: 1 and Ondansetron , Dexamethasone  and Midazolam   Airway Management Planned: LMA  Additional Equipment:   Intra-op Plan:   Post-operative Plan: Extubation in OR  Informed Consent: I have reviewed the patients History and Physical, chart, labs and discussed the procedure including the risks, benefits and alternatives for the proposed anesthesia with the patient or authorized representative who has indicated  his/her understanding and acceptance.     Dental advisory given  Plan Discussed with: CRNA  Anesthesia Plan Comments:         Anesthesia Quick Evaluation

## 2024-05-07 ENCOUNTER — Encounter (HOSPITAL_COMMUNITY)
Admission: RE | Disposition: A | Discharge status: Home / Self Care | Payer: Self-pay | Source: Home / Self Care | Attending: Urology

## 2024-05-07 ENCOUNTER — Ambulatory Visit (HOSPITAL_BASED_OUTPATIENT_CLINIC_OR_DEPARTMENT_OTHER): Admitting: Anesthesiology

## 2024-05-07 ENCOUNTER — Encounter (HOSPITAL_COMMUNITY): Payer: Self-pay | Admitting: Urology

## 2024-05-07 ENCOUNTER — Ambulatory Visit (HOSPITAL_COMMUNITY): Admitting: Medical

## 2024-05-07 ENCOUNTER — Ambulatory Visit (HOSPITAL_COMMUNITY)
Admission: RE | Admit: 2024-05-07 | Discharge: 2024-05-07 | Disposition: A | Discharge status: Home / Self Care | Attending: Urology | Admitting: Urology

## 2024-05-07 DIAGNOSIS — Z7984 Long term (current) use of oral hypoglycemic drugs: Secondary | ICD-10-CM | POA: Insufficient documentation

## 2024-05-07 DIAGNOSIS — E119 Type 2 diabetes mellitus without complications: Secondary | ICD-10-CM | POA: Insufficient documentation

## 2024-05-07 DIAGNOSIS — I10 Essential (primary) hypertension: Secondary | ICD-10-CM

## 2024-05-07 DIAGNOSIS — F17210 Nicotine dependence, cigarettes, uncomplicated: Secondary | ICD-10-CM

## 2024-05-07 DIAGNOSIS — G4730 Sleep apnea, unspecified: Secondary | ICD-10-CM | POA: Diagnosis not present

## 2024-05-07 DIAGNOSIS — Z79899 Other long term (current) drug therapy: Secondary | ICD-10-CM | POA: Diagnosis not present

## 2024-05-07 DIAGNOSIS — Z01818 Encounter for other preprocedural examination: Secondary | ICD-10-CM

## 2024-05-07 DIAGNOSIS — A630 Anogenital (venereal) warts: Secondary | ICD-10-CM | POA: Diagnosis present

## 2024-05-07 HISTORY — PX: LESION DESTRUCTION: SHX5132

## 2024-05-07 LAB — GLUCOSE, CAPILLARY
Glucose-Capillary: 137 mg/dL — ABNORMAL HIGH (ref 70–99)
Glucose-Capillary: 124 mg/dL — ABNORMAL HIGH (ref 70–99)

## 2024-05-07 SURGERY — DESTRUCTION, LESION, PENIS
Anesthesia: General

## 2024-05-07 MED ORDER — ONDANSETRON HCL 4 MG/2ML IJ SOLN
INTRAMUSCULAR | Status: DC | PRN
  Administered 2024-05-07: 4 mg via INTRAVENOUS

## 2024-05-07 MED ORDER — BUPIVACAINE HCL (PF) 0.5 % IJ SOLN
INTRAMUSCULAR | Status: AC
  Filled 2024-05-07: qty 30

## 2024-05-07 MED ORDER — 0.9 % SODIUM CHLORIDE (POUR BTL) OPTIME
TOPICAL | Status: DC | PRN
Start: 2024-05-07 — End: 2024-05-07
  Administered 2024-05-07: 1000 mL

## 2024-05-07 MED ORDER — ACETIC ACID 5 % SOLN
Status: AC
  Filled 2024-05-07: qty 12

## 2024-05-07 MED ORDER — PHENYLEPHRINE 80 MCG/ML (10ML) SYRINGE FOR IV PUSH (FOR BLOOD PRESSURE SUPPORT)
PREFILLED_SYRINGE | INTRAVENOUS | Status: DC | PRN
  Administered 2024-05-07 (×3): 160 ug via INTRAVENOUS

## 2024-05-07 MED ORDER — SILVER SULFADIAZINE 1 % EX CREA: TOPICAL_CREAM | CUTANEOUS | 1 refills | Status: DC

## 2024-05-07 MED ORDER — MIDAZOLAM HCL 5 MG/5ML IJ SOLN
INTRAMUSCULAR | Status: DC | PRN
  Administered 2024-05-07: 2 mg via INTRAVENOUS

## 2024-05-07 MED ORDER — CHLORHEXIDINE GLUCONATE 0.12 % MT SOLN
15.0000 mL | Freq: Once | OROMUCOSAL | Status: AC
  Administered 2024-05-07: 15 mL via OROMUCOSAL

## 2024-05-07 MED ORDER — LIDOCAINE HCL (PF) 2 % IJ SOLN
INTRAMUSCULAR | Status: DC | PRN
Start: 2024-05-07 — End: 2024-05-07
  Administered 2024-05-07: 100 mg via INTRADERMAL

## 2024-05-07 MED ORDER — EPHEDRINE 5 MG/ML INJ
INTRAVENOUS | Status: AC
  Filled 2024-05-07: qty 5

## 2024-05-07 MED ORDER — OXYCODONE HCL 5 MG PO TABS: 5.0000 mg | ORAL_TABLET | Freq: Once | ORAL | Status: DC | PRN

## 2024-05-07 MED ORDER — OXYCODONE HCL 5 MG/5ML PO SOLN: 5.0000 mg | Freq: Once | ORAL | Status: DC | PRN

## 2024-05-07 MED ORDER — ACETIC ACID 5 % SOLN
Status: DC | PRN
  Administered 2024-05-07: 1 via TOPICAL

## 2024-05-07 MED ORDER — METHOCARBAMOL 750 MG PO TABS: 750.0000 mg | ORAL_TABLET | Freq: Four times a day (QID) | ORAL | 0 refills | Status: AC

## 2024-05-07 MED ORDER — BUPIVACAINE HCL (PF) 0.5 % IJ SOLN
INTRAMUSCULAR | Status: DC | PRN
  Administered 2024-05-07: 10 mL

## 2024-05-07 MED ORDER — FENTANYL CITRATE PF 50 MCG/ML IJ SOSY
25.0000 ug | PREFILLED_SYRINGE | INTRAMUSCULAR | Status: DC | PRN
Start: 2024-05-07 — End: 2024-05-07

## 2024-05-07 MED ORDER — LIDOCAINE HCL (PF) 1 % IJ SOLN
INTRAMUSCULAR | Status: AC
  Filled 2024-05-07: qty 30

## 2024-05-07 MED ORDER — EPHEDRINE SULFATE-NACL 50-0.9 MG/10ML-% IV SOSY
PREFILLED_SYRINGE | INTRAVENOUS | Status: DC | PRN
  Administered 2024-05-07 (×2): 10 mg via INTRAVENOUS
  Administered 2024-05-07: 5 mg via INTRAVENOUS

## 2024-05-07 MED ORDER — ONDANSETRON HCL 4 MG/2ML IJ SOLN
INTRAMUSCULAR | Status: AC
  Filled 2024-05-07: qty 2

## 2024-05-07 MED ORDER — SILVER SULFADIAZINE 1 % EX CREA
1.0000 | TOPICAL_CREAM | Freq: Once | CUTANEOUS | Status: AC
Start: 2024-05-07 — End: 2024-05-07
  Administered 2024-05-07: 1 via TOPICAL
  Filled 2024-05-07: qty 50

## 2024-05-07 MED ORDER — CEFAZOLIN SODIUM-DEXTROSE 2-4 GM/100ML-% IV SOLN
2.0000 g | INTRAVENOUS | Status: AC
  Administered 2024-05-07: 2 g via INTRAVENOUS
  Filled 2024-05-07: qty 100

## 2024-05-07 MED ORDER — DEXAMETHASONE SODIUM PHOSPHATE 10 MG/ML IJ SOLN
INTRAMUSCULAR | Status: DC | PRN
  Administered 2024-05-07: 10 mg via INTRAVENOUS

## 2024-05-07 MED ORDER — PROPOFOL 500 MG/50ML IV EMUL
INTRAVENOUS | Status: AC
  Filled 2024-05-07: qty 50

## 2024-05-07 MED ORDER — POLYETHYLENE GLYCOL 3350 17 G PO PACK: 17.0000 g | PACK | Freq: Every day | ORAL | 0 refills | Status: DC

## 2024-05-07 MED ORDER — FENTANYL CITRATE (PF) 100 MCG/2ML IJ SOLN
INTRAMUSCULAR | Status: DC | PRN
  Administered 2024-05-07: 100 ug via INTRAVENOUS

## 2024-05-07 MED ORDER — LIDOCAINE HCL (PF) 2 % IJ SOLN
INTRAMUSCULAR | Status: AC
  Filled 2024-05-07: qty 5

## 2024-05-07 MED ORDER — FENTANYL CITRATE (PF) 100 MCG/2ML IJ SOLN
INTRAMUSCULAR | Status: AC
Start: 2024-05-07 — End: ?
  Filled 2024-05-07: qty 2

## 2024-05-07 MED ORDER — PROPOFOL 10 MG/ML IV BOLUS
INTRAVENOUS | Status: DC | PRN
  Administered 2024-05-07: 300 mg via INTRAVENOUS

## 2024-05-07 MED ORDER — AMISULPRIDE (ANTIEMETIC) 5 MG/2ML IV SOLN: 10.0000 mg | Freq: Once | INTRAVENOUS | Status: DC | PRN

## 2024-05-07 MED ORDER — DEXAMETHASONE SODIUM PHOSPHATE 10 MG/ML IJ SOLN
INTRAMUSCULAR | Status: AC
  Filled 2024-05-07: qty 1

## 2024-05-07 MED ORDER — LACTATED RINGERS IV SOLN: INTRAVENOUS | Status: DC

## 2024-05-07 MED ORDER — ACETAMINOPHEN 500 MG PO TABS
1000.0000 mg | ORAL_TABLET | Freq: Once | ORAL | Status: AC
  Administered 2024-05-07: 1000 mg via ORAL
  Filled 2024-05-07: qty 2

## 2024-05-07 MED ORDER — BACITRACIN-NEOMYCIN-POLYMYXIN OINTMENT TUBE
TOPICAL_OINTMENT | CUTANEOUS | Status: AC
  Filled 2024-05-07: qty 14.17

## 2024-05-07 MED ORDER — INSULIN ASPART 100 UNIT/ML IJ SOLN: 0.0000 [IU] | INTRAMUSCULAR | Status: DC | PRN

## 2024-05-07 MED ORDER — OXYCODONE HCL 5 MG PO TABS: 5.0000 mg | ORAL_TABLET | ORAL | 0 refills | Status: DC | PRN

## 2024-05-07 MED ORDER — ORAL CARE MOUTH RINSE: 15.0000 mL | Freq: Once | OROMUCOSAL | Status: AC

## 2024-05-07 MED ORDER — MIDAZOLAM HCL 2 MG/2ML IJ SOLN
INTRAMUSCULAR | Status: AC
  Filled 2024-05-07: qty 2

## 2024-05-07 SURGICAL SUPPLY — 20 items
BLADE SURG 15 STRL LF DISP TIS (BLADE) IMPLANT
BNDG GAUZE DERMACEA FLUFF 4 (GAUZE/BANDAGES/DRESSINGS) ×2 IMPLANT
BRIEF MESH DISP LRG (UNDERPADS AND DIAPERS) ×2 IMPLANT
CLOTH BEACON ORANGE TIMEOUT ST (SAFETY) ×2 IMPLANT
COVER BACK TABLE 60X90IN (DRAPES) ×2 IMPLANT
COVER MAYO STAND STRL (DRAPES) ×2 IMPLANT
DEPRESSOR TONGUE 6 IN STERILE (GAUZE/BANDAGES/DRESSINGS) ×2 IMPLANT
DRAPE LAPAROTOMY T 98X78 PEDS (DRAPES) ×2 IMPLANT
GAUZE 4X4 16PLY ~~LOC~~+RFID DBL (SPONGE) ×2 IMPLANT
GLOVE BIO SURGEON STRL SZ8 (GLOVE) ×2 IMPLANT
GOWN STRL REUS W/TWL LRG LVL3 (GOWN DISPOSABLE) ×2 IMPLANT
GOWN STRL REUS W/TWL XL LVL3 (GOWN DISPOSABLE) ×2 IMPLANT
KIT BASIN OR (CUSTOM PROCEDURE TRAY) ×2 IMPLANT
KIT TURNOVER KIT A (KITS) ×2 IMPLANT
MANIFOLD NEPTUNE II (INSTRUMENTS) ×2 IMPLANT
PENCIL SMOKE EVACUATOR (MISCELLANEOUS) IMPLANT
SLEEVE SCD COMPRESS KNEE MED (STOCKING) ×2 IMPLANT
TOWEL OR 17X24 6PK STRL BLUE (TOWEL DISPOSABLE) ×4 IMPLANT
TUBE CONNECTING 12X1/4 (SUCTIONS) IMPLANT
VACUUM HOSE 7/8X10 W/ WAND (MISCELLANEOUS) ×2 IMPLANT

## 2024-05-07 NOTE — Op Note (Addendum)
 Preoperative diagnosis:  Genital warts total of 2.5 cm  Postoperative diagnosis:  Same   Procedure: CO2 laser ablation of genital warts 6 total warts for total of 2.5 cm  Surgeon: Aimee Alf  Anesthesia: General  Complications: None  Intraoperative findings: 6 warts of 2.5 cm total diameter located on all located on the penis  All areas identified were burned with laser, no scarring depth deeper than the dermis  EBL: Minimal  Specimens: None  Indication: Scott Schaefer is a 63 y.o. patient with genital warts desires removal for cosmetic causes.  After reviewing the management options for treatment, he elected to proceed with the above surgical procedure(s). We have discussed the potential benefits and risks of the procedure, side effects of the proposed treatment, the likelihood of the patient achieving the goals of the procedure, and any potential problems that might occur during the procedure or recuperation. Informed consent has been obtained.  Procedure: Patient was taken to the operative suite patient imaging anesthesia by the anesthesia team he was then placed in the supine position the patient's scrotum and general area was shaved making sure to keep the affected area clear of any here.  After time was performed and preoperative antibiotics were given the marked area was surrounded by wet towels to prevent fire.  The proper equipment was used including suction all staff in the room or N95 masks.  Timeout was then completed then a penile block was performed using local anesthetic  Next the CO2 laser was set to a setting of 6 and then used to ablate all the general wart tissue.  Acetic acid was used to identify the affected lesions.  No area was burned greater than 3 cm in diameter.  All the genital wart lesions were burned until no identifiable lesions were remaining.  Silvadene was placed over the burned areas and mesh panties and fluffs were placed at the the general area.  The patient was subsequently awoken and returned to PACU in excellent condition. There no immediate complications.

## 2024-05-07 NOTE — Transfer of Care (Signed)
 Immediate Anesthesia Transfer of Care Note  Patient: Scott Schaefer  Procedure(s) Performed: DESTRUCTION, LESION, PENIS  Patient Location: PACU  Anesthesia Type:General  Level of Consciousness: sedated  Airway & Oxygen Therapy: Patient Spontanous Breathing and Patient connected to face mask oxygen  Post-op Assessment: Report given to RN and Post -op Vital signs reviewed and stable  Post vital signs: Reviewed and stable  Last Vitals:  Vitals Value Taken Time  BP    Temp    Pulse 65 05/07/24 1238  Resp 15 05/07/24 1238  SpO2 98 % 05/07/24 1238  Vitals shown include unfiled device data.  Last Pain:  Vitals:   05/07/24 0917  TempSrc:   PainSc: 0-No pain         Complications: No notable events documented.

## 2024-05-07 NOTE — Discharge Instructions (Addendum)
 Discharge instructions following CO2 laser therapy.   Call your doctor for: Fever is greater than 100.5 Severe nausea or vomiting Increasing pain not controlled by pain medication Increasing redness or drainage from incisions  The number for questions or concerns is [phone]  Activity level: No lifting greater than 20 pounds (about equal to milk) for the next 1 week or until cleared to do so at follow-up appointment.  Otherwise activity as tolerated by comfort level.  Diet: May resume your regular diet as tolerated  Driving: No driving while still taking opiate pain medications (weight at least 6-8 hours after last dose).  No driving if you still sore from surgery as it may limit her ability to react quickly if necessary.   Shower/bath: May shower and get incision/burn wet pad dry immediately following.  Do not scrub vigorously for the next 2-3 weeks.  Do not soak incision (ID soaking in bath or swimming) until 3 weeks pos top.   Wound care: You can also apply Silvadene to the skin to prevent infections. Take tylenol  and ibuprofen for pain management along with your other medications.   You also have some sutures on the bottom of your penis these will dissolve over time.   Residual Warts:Until follow up apply the Imiquimod cream to the areas that were not burned. This should be applied 3x a week at night before bedtime. Rub it into affected areas gently. Allow the medicine to stay on the skin for 8 hours. After that duration has passed you can wash the area with soap and water. After each application wash your hands thoroughly.   Follow-up appointments: Follow-up appointment will be scheduled with Dr. Cathi Cluster for a wound check 3 weeks and also be called for a repeat surgery 1 to 2 weeks after that follow up.

## 2024-05-07 NOTE — Anesthesia Procedure Notes (Signed)
 Procedure Name: LMA Insertion Date/Time: 05/07/2024 11:45 AM  Performed by: Micky Albee, CRNAPre-anesthesia Checklist: Patient identified, Emergency Drugs available, Suction available, Patient being monitored and Timeout performed Patient Re-evaluated:Patient Re-evaluated prior to induction Oxygen Delivery Method: Circle system utilized Preoxygenation: Pre-oxygenation with 100% oxygen Induction Type: IV induction LMA: LMA inserted LMA Size: 5.0 Tube type: Oral Number of attempts: 1 Placement Confirmation: positive ETCO2 and breath sounds checked- equal and bilateral Tube secured with: Tape Dental Injury: Teeth and Oropharynx as per pre-operative assessment

## 2024-05-07 NOTE — Anesthesia Postprocedure Evaluation (Signed)
 Anesthesia Post Note  Patient: Scott Schaefer  Procedure(s) Performed: DESTRUCTION, LESION, PENIS     Patient location during evaluation: PACU Anesthesia Type: General Level of consciousness: awake and alert Pain management: pain level controlled Vital Signs Assessment: post-procedure vital signs reviewed and stable Respiratory status: spontaneous breathing, nonlabored ventilation, respiratory function stable and patient connected to nasal cannula oxygen Cardiovascular status: blood pressure returned to baseline and stable Postop Assessment: no apparent nausea or vomiting Anesthetic complications: no  No notable events documented.  Last Vitals:  Vitals:   05/07/24 1315 05/07/24 1330  BP: (!) 155/85 (!) 161/94  Pulse: 80 69  Resp: 15   Temp:  36.5 C  SpO2: 94% 100%    Last Pain:  Vitals:   05/07/24 1330  TempSrc:   PainSc: 0-No pain                 Dj Senteno L Geoff Dacanay

## 2024-05-08 ENCOUNTER — Encounter (HOSPITAL_COMMUNITY): Payer: Self-pay | Admitting: Urology

## 2024-10-19 ENCOUNTER — Other Ambulatory Visit: Payer: Self-pay | Admitting: Urology

## 2024-10-23 NOTE — Patient Instructions (Addendum)
 SURGICAL WAITING ROOM VISITATION  Patients having surgery or a procedure may have no more than 2 support people in the waiting area - these visitors may rotate.    Children under the age of 15 must have an adult with them who is not the patient.  Visitors with respiratory illnesses are discouraged from visiting and should remain at home.  If the patient needs to stay at the hospital during part of their recovery, the visitor guidelines for inpatient rooms apply. Pre-op nurse will coordinate an appropriate time for 1 support person to accompany patient in pre-op.  This support person may not rotate.    Please refer to the [facility] website for the visitor guidelines for Inpatients (after your surgery is over and you are in a regular room).    Your procedure is scheduled on: 11/05/24   Report to [facility] Main Entrance    Report to admitting at 11:50 AM   Call this number if you have problems the morning of surgery [phone]   Do not eat food or drink liquids :After Midnight.          If you have questions, please contact your surgeon's office.   FOLLOW BOWEL PREP AND ANY ADDITIONAL PRE OP INSTRUCTIONS YOU RECEIVED FROM YOUR SURGEON'S OFFICE!!!     Oral Hygiene is also important to reduce your risk of infection.                                    Remember - BRUSH YOUR TEETH THE MORNING OF SURGERY WITH YOUR REGULAR TOOTHPASTE  DENTURES WILL BE REMOVED PRIOR TO SURGERY PLEASE DO NOT APPLY Poly grip OR ADHESIVES!!!   Do NOT smoke after Midnight   Stop all vitamins and herbal supplements 7 days before surgery.   Take these medicines the morning of surgery with A SIP OF WATER: Alprazolam , Amlodipine , Atorvastatin , Metoprolol , Paxil   DO NOT TAKE ANY ORAL DIABETIC MEDICATIONS DAY OF YOUR SURGERY  How to Manage Your Diabetes Before and After Surgery  Why is it important to control my blood sugar before and after surgery? Improving blood sugar levels before and  after surgery helps healing and can limit problems. A way of improving blood sugar control is eating a healthy diet by:  Eating less sugar and carbohydrates  Increasing activity/exercise  Talking with your doctor about reaching your blood sugar goals High blood sugars (greater than 180 mg/dL) can raise your risk of infections and slow your recovery, so you will need to focus on controlling your diabetes during the weeks before surgery. Make sure that the doctor who takes care of your diabetes knows about your planned surgery including the date and location.  How do I manage my blood sugar before surgery? Check your blood sugar at least 4 times a day, starting 2 days before surgery, to make sure that the level is not too high or low. Check your blood sugar the morning of your surgery when you wake up and every 2 hours until you get to the Short Stay unit. If your blood sugar is less than 70 mg/dL, you will need to treat for low blood sugar: Do not take insulin . Treat a low blood sugar (less than 70 mg/dL) with  cup of clear juice (cranberry or apple), 4 glucose tablets, OR glucose gel. Recheck blood sugar in 15 minutes after treatment (to make sure it is greater than 70 mg/dL).  If your blood sugar is not greater than 70 mg/dL on recheck, call [phone] for further instructions. Report your blood sugar to the short stay nurse when you get to Short Stay.  If you are admitted to the hospital after surgery: Your blood sugar will be checked by the staff and you will probably be given insulin  after surgery (instead of oral diabetes medicines) to make sure you have good blood sugar levels. The goal for blood sugar control after surgery is 80-180 mg/dL.   WHAT DO I DO ABOUT MY DIABETES MEDICATION?  Do not take oral diabetes medicines (pills) the morning of surgery.   Do not take Trulicity after 10/28/24.  DO NOT TAKE THE FOLLOWING 7 DAYS PRIOR TO SURGERY: Ozempic, Wegovy, Rybelsus  (Semaglutide), Byetta (exenatide), Bydureon (exenatide ER), Victoza, Saxenda (liraglutide), or Trulicity (dulaglutide) Mounjaro (Tirzepatide) Adlyxin (Lixisenatide), Polyethylene Glycol Loxenatide.  Reviewed and Endorsed by [facility] Patient Education Committee, August 2015                              You may not have any metal on your body including jewelry, and body piercing             Do not wear lotions, powders, cologne, or deodorant              Men may shave face and neck.   Do not bring valuables to the hospital. [facility] IS NOT             RESPONSIBLE   FOR VALUABLES.   Contacts, glasses, dentures or bridgework may not be worn into surgery.  DO NOT BRING YOUR HOME MEDICATIONS TO THE HOSPITAL. PHARMACY WILL DISPENSE MEDICATIONS LISTED ON YOUR MEDICATION LIST TO YOU DURING YOUR ADMISSION IN THE HOSPITAL!    Patients discharged on the day of surgery will not be allowed to drive home.  Someone NEEDS to stay with you for the first 24 hours after anesthesia.              Please read over the following fact sheets you were given: IF YOU HAVE QUESTIONS ABOUT YOUR PRE-OP INSTRUCTIONS PLEASE CALL [phone].   If you received a COVID test during your pre-op visit  it is requested that you wear a mask when out in public, stay away from anyone that may not be feeling well and notify your surgeon if you develop symptoms. If you test positive for Covid or have been in contact with anyone that has tested positive in the last 10 days please notify you surgeon.    [place] - Preparing for Surgery Before surgery, you can play an important role.  Because skin is not sterile, your skin needs to be as free of germs as possible.  You can reduce the number of germs on your skin by washing with CHG (chlorahexidine gluconate) soap before surgery.  CHG is an antiseptic cleaner which kills germs and bonds with the skin to continue killing germs even after washing. Please DO NOT use if  you have an allergy to CHG or antibacterial soaps.  If your skin becomes reddened/irritated stop using the CHG and inform your nurse when you arrive at Short Stay. Do not shave (including legs and underarms) for at least 48 hours prior to the first CHG shower.  You may shave your face/neck.  Please follow these instructions carefully:  1.  Shower with CHG Soap the night before surgery ONLY (  DO NOT USE THE SOAP THE MORNING OF SURGERY).  2.  If you choose to wash your hair, wash your hair first as usual with your normal  shampoo.  3.  After you shampoo, rinse your hair and body thoroughly to remove the shampoo.                             4.  Use CHG as you would any other liquid soap.  You can apply chg directly to the skin and wash.  Gently with a scrungie or clean washcloth.  5.  Apply the CHG Soap to your body ONLY FROM THE NECK DOWN.   Do   not use on face/ open                           Wound or open sores. Avoid contact with eyes, ears mouth and   genitals (private parts).                       Wash face,  Genitals (private parts) with your normal soap.             6.  Wash thoroughly, paying special attention to the area where your    surgery  will be performed.  7.  Thoroughly rinse your body with warm water from the neck down.  8.  DO NOT shower/wash with your normal soap after using and rinsing off the CHG Soap.                9.  Pat yourself dry with a clean towel.            10.  Wear clean pajamas.            11.  Place clean sheets on your bed the night of your first shower and do not  sleep with pets. Day of Surgery : Do not apply any CHG, lotions/deodorants the morning of surgery.  Please wear clean clothes to the hospital/surgery center.  FAILURE TO FOLLOW THESE INSTRUCTIONS MAY RESULT IN THE CANCELLATION OF YOUR SURGERY  PATIENT SIGNATURE_________________________________  NURSE  SIGNATURE__________________________________  ________________________________________________________________________

## 2024-10-23 NOTE — Progress Notes (Addendum)
 Date of COVID positive in last 90 days:  PCP - [place]  Cleotilde, PA Cardiologist - n/a  Chest x-ray - N/A EKG - 11/21/23 Epic Stress Test - N/A ECHO - N/A Cardiac Cath - N/A Pacemaker/ICD device last checked:N/A Spinal Cord Stimulator:N/A  Bowel Prep - N/A  Sleep Study - yes CPAP - no, did not need per pt  Fasting Blood Sugar - 113-130 Checks Blood Sugar 1 times a day  Last dose of GLP1 agonist-  Trulicity- takes Mondays GLP1 instructions:  Do not take after  10/28/24   Last dose of SGLT-2 inhibitors-  N/A SGLT-2 instructions:  Do not take after     Blood Thinner Instructions: N/A Last dose:   Time: Aspirin Instructions:N/A Last Dose:  Activity level: Can go up a flight of stairs and perform activities of daily living without stopping and without symptoms of chest pain or shortness of breath.  Anesthesia review: N/A  Patient denies shortness of breath, fever, cough and chest pain at PAT appointment  Patient verbalized understanding of instructions that were given to them at the PAT appointment. Patient was also instructed that they will need to review over the PAT instructions again at home before surgery.

## 2024-10-27 ENCOUNTER — Other Ambulatory Visit: Payer: Self-pay

## 2024-10-27 ENCOUNTER — Encounter (HOSPITAL_COMMUNITY)
Admission: RE | Admit: 2024-10-27 | Discharge: 2024-10-27 | Disposition: A | Source: Ambulatory Visit | Attending: Urology

## 2024-10-27 ENCOUNTER — Encounter (HOSPITAL_COMMUNITY): Payer: Self-pay

## 2024-10-27 VITALS — BP 145/90 | HR 70 | Temp 98.3°F | Resp 14 | Ht 74.0 in

## 2024-10-27 DIAGNOSIS — Z01818 Encounter for other preprocedural examination: Secondary | ICD-10-CM | POA: Diagnosis present

## 2024-10-27 DIAGNOSIS — Z01812 Encounter for preprocedural laboratory examination: Secondary | ICD-10-CM | POA: Insufficient documentation

## 2024-10-27 DIAGNOSIS — E119 Type 2 diabetes mellitus without complications: Secondary | ICD-10-CM | POA: Insufficient documentation

## 2024-10-27 LAB — CBC
HCT: 40.3 % (ref 39.0–52.0)
Hemoglobin: 13.5 g/dL (ref 13.0–17.0)
MCH: 28.1 pg (ref 26.0–34.0)
MCHC: 33.5 g/dL (ref 30.0–36.0)
MCV: 83.8 fL (ref 80.0–100.0)
Platelets: 249 K/uL (ref 150–400)
RBC: 4.81 MIL/uL (ref 4.22–5.81)
RDW: 16.2 % — ABNORMAL HIGH (ref 11.5–15.5)
WBC: 5.3 K/uL (ref 4.0–10.5)
nRBC: 0 % (ref 0.0–0.2)

## 2024-10-27 LAB — BASIC METABOLIC PANEL WITH GFR
Anion gap: 12 (ref 5–15)
BUN: 6 mg/dL — ABNORMAL LOW (ref 8–23)
CO2: 25 mmol/L (ref 22–32)
Calcium: 9.5 mg/dL (ref 8.9–10.3)
Chloride: 99 mmol/L (ref 98–111)
Creatinine, Ser: 0.76 mg/dL (ref 0.61–1.24)
GFR, Estimated: >60 mL/min (ref >60)
Glucose, Bld: 118 mg/dL — ABNORMAL HIGH (ref 70–99)
Potassium: 3.9 mmol/L (ref 3.5–5.1)
Sodium: 136 mmol/L (ref 135–145)

## 2024-10-27 LAB — GLUCOSE, CAPILLARY: Glucose-Capillary: 101 mg/dL — ABNORMAL HIGH (ref 70–99)

## 2024-10-27 LAB — HEMOGLOBIN A1C
Hgb A1c MFr Bld: 6.8 % — ABNORMAL HIGH (ref 4.8–5.6)
Mean Plasma Glucose: 148.46 mg/dL

## 2024-11-03 NOTE — H&P (Signed)
 H&P   Chief Complaint: Genital Wart  History of Present Illness:  63 year old male presents emergency escorted for elevated PSAs. As well as lower urinary tract symptoms and ED. PSA at the time of last visitation was normal, he was tried on tolterodine which then lost to follow-up. He returns today for genital warts that have been present for the past year per OSH records there is No pain and STI work up negative.  PMH: Diabetes, HLD, HTN, anxiety, arthiris, depression. NoCards, no blood thinners, Pulm hx negative, current smoker. PSH: Anterior fusion  Genital warts: 04/08/24: no pain, no issues, he is in monogomous relationship. unclear how long it has been there. 06/11/2024: Patient here 1 month s/p CO2 laser ablation of genital warts. His postop period has been uneventful. His lesions have healed well, denies urinary symptoms. 10/14/24: ventral genital wart on exam.  Past Medical History:  Diagnosis Date   Anxiety    Arthritis    Depression    Diabetes mellitus    GERD (gastroesophageal reflux disease)    Hyperlipidemia    Hypertension    Sleep apnea    has cpap but does not use   Past Surgical History:  Procedure Laterality Date   ANTERIOR CERVICAL DECOMP/DISCECTOMY FUSION N/A 11/25/2023   Procedure: ANTERIOR CERVICAL DECOMPRESSION/DISCECTOMY FUSION 1 LEVEL C3-4;  Surgeon: Burnetta Aures, MD;  Location: [facility] OR;  Service: Orthopedics;  Laterality: N/A;  3 hrs 3 C-Bed   COLONOSCOPY  2023   2023   LESION DESTRUCTION N/A 05/07/2024   Procedure: DESTRUCTION, LESION, PENIS;  Surgeon: Shane Steffan BROCKS, MD;  Location: [facility] ORS;  Service: Urology;  Laterality: N/A;    Home Medications:  No medications prior to admission.   Allergies:  Allergies  Allergen Reactions   Ace Inhibitors Other (See Comments)    Angioedema - history of angioedema in the past, history of lisinopril use, questionable confounding factor with consumption of fish   Lisinopril Anaphylaxis and Shortness Of  Breath    Family History  Problem Relation Age of Onset   Colon polyps Sister    Hypertension Sister    Hypertension Brother    Diabetes Maternal Grandmother    Colon cancer Neg Hx    Stomach cancer Neg Hx    Esophageal cancer Neg Hx    Rectal cancer Neg Hx    Social History:  reports that he has been smoking cigarettes. He has never used smokeless tobacco. He reports current alcohol use of about 3.0 - 4.0 standard drinks of alcohol per week. He reports that he does not use drugs.  ROS: A complete review of systems was performed.  All systems are negative except for pertinent findings as noted. ROS   Physical Exam:  Vital signs in last 24 hours:   General: NAD Respiratory: normal WOB on RA Cards: RRR per monitor   Laboratory Data:  No results found for this or any previous visit (from the past 24 hours). No results found for this or any previous visit (from the past 240 hours). Creatinine: No results for input(s): CREATININE in the last 168 hours.  Impression/Assessment:  [name] hx of genital warts.  Penile lesion: Small penile lesion on the dorsum of the penile shaft patient would like this excision we will plan for OR excision we discussed risk benefits alternatives procedure including bleeding patient demonstrated structures including the dorsum of the penis possible finding malignancy possible injury to the nerves patient is agreeable we will proceed with penile skin excision.  Plan:  Proceed with wart excision   Steffan JAYSON Pea 11/03/2024, 4:02 PM

## 2024-11-05 ENCOUNTER — Encounter (HOSPITAL_COMMUNITY): Payer: Self-pay | Admitting: Urology

## 2024-11-05 ENCOUNTER — Ambulatory Visit (HOSPITAL_COMMUNITY): Admitting: Anesthesiology

## 2024-11-05 ENCOUNTER — Encounter (HOSPITAL_COMMUNITY)
Admission: RE | Disposition: A | Discharge status: Home / Self Care | Payer: Self-pay | Source: Home / Self Care | Attending: Urology

## 2024-11-05 ENCOUNTER — Other Ambulatory Visit: Payer: Self-pay

## 2024-11-05 ENCOUNTER — Ambulatory Visit (HOSPITAL_COMMUNITY)
Admission: RE | Admit: 2024-11-05 | Discharge: 2024-11-05 | Disposition: A | Discharge status: Home / Self Care | Attending: Urology | Admitting: Urology

## 2024-11-05 DIAGNOSIS — A630 Anogenital (venereal) warts: Secondary | ICD-10-CM | POA: Insufficient documentation

## 2024-11-05 DIAGNOSIS — G4730 Sleep apnea, unspecified: Secondary | ICD-10-CM | POA: Diagnosis not present

## 2024-11-05 DIAGNOSIS — A5131 Condyloma latum: Secondary | ICD-10-CM

## 2024-11-05 DIAGNOSIS — Z91199 Patient's noncompliance with other medical treatment and regimen due to unspecified reason: Secondary | ICD-10-CM | POA: Diagnosis not present

## 2024-11-05 DIAGNOSIS — K219 Gastro-esophageal reflux disease without esophagitis: Secondary | ICD-10-CM | POA: Insufficient documentation

## 2024-11-05 DIAGNOSIS — F17210 Nicotine dependence, cigarettes, uncomplicated: Secondary | ICD-10-CM | POA: Insufficient documentation

## 2024-11-05 DIAGNOSIS — E119 Type 2 diabetes mellitus without complications: Secondary | ICD-10-CM | POA: Insufficient documentation

## 2024-11-05 DIAGNOSIS — M1990 Unspecified osteoarthritis, unspecified site: Secondary | ICD-10-CM | POA: Diagnosis not present

## 2024-11-05 DIAGNOSIS — Z8249 Family history of ischemic heart disease and other diseases of the circulatory system: Secondary | ICD-10-CM | POA: Insufficient documentation

## 2024-11-05 DIAGNOSIS — I10 Essential (primary) hypertension: Secondary | ICD-10-CM

## 2024-11-05 DIAGNOSIS — Z833 Family history of diabetes mellitus: Secondary | ICD-10-CM | POA: Insufficient documentation

## 2024-11-05 HISTORY — PX: CONDYLOMA EXCISION/FULGURATION: SHX1389

## 2024-11-05 LAB — GLUCOSE, CAPILLARY: Glucose-Capillary: 106 mg/dL — ABNORMAL HIGH (ref 70–99)

## 2024-11-05 SURGERY — REMOVAL, CONDYLOMA
Anesthesia: Monitor Anesthesia Care | Site: Perineum

## 2024-11-05 MED ORDER — POLYETHYLENE GLYCOL 3350 17 G PO PACK
17.0000 g | PACK | Freq: Every day | ORAL | 0 refills | Status: AC
Start: 2024-11-05 — End: ?

## 2024-11-05 MED ORDER — MIDAZOLAM HCL (PF) 2 MG/2ML IJ SOLN: 0.5000 mg | Freq: Once | INTRAMUSCULAR | Status: DC | PRN

## 2024-11-05 MED ORDER — FENTANYL CITRATE (PF) 50 MCG/ML IJ SOSY: 25.0000 ug | PREFILLED_SYRINGE | INTRAMUSCULAR | Status: DC | PRN

## 2024-11-05 MED ORDER — ACETAMINOPHEN 500 MG PO TABS
1000.0000 mg | ORAL_TABLET | Freq: Once | ORAL | Status: AC
  Administered 2024-11-05: 1000 mg via ORAL
  Filled 2024-11-05: qty 2

## 2024-11-05 MED ORDER — LIDOCAINE HCL (PF) 2 % IJ SOLN
INTRAMUSCULAR | Status: DC | PRN
  Administered 2024-11-05: 100 mg via INTRADERMAL

## 2024-11-05 MED ORDER — LACTATED RINGERS IV SOLN: INTRAVENOUS | Status: DC

## 2024-11-05 MED ORDER — PROPOFOL 10 MG/ML IV BOLUS
INTRAVENOUS | Status: AC
  Filled 2024-11-05: qty 20

## 2024-11-05 MED ORDER — MEPERIDINE HCL 25 MG/ML IJ SOLN: 6.2500 mg | INTRAMUSCULAR | Status: DC | PRN

## 2024-11-05 MED ORDER — MIDAZOLAM HCL 5 MG/5ML IJ SOLN
INTRAMUSCULAR | Status: DC | PRN
  Administered 2024-11-05: 2 mg via INTRAVENOUS

## 2024-11-05 MED ORDER — MIDAZOLAM HCL 2 MG/2ML IJ SOLN
INTRAMUSCULAR | Status: AC
  Filled 2024-11-05: qty 2

## 2024-11-05 MED ORDER — ACETIC ACID 5 % SOLN
Status: AC
  Filled 2024-11-05: qty 12

## 2024-11-05 MED ORDER — HYDROCODONE-ACETAMINOPHEN 5-325 MG PO TABS: 1.0000 | ORAL_TABLET | Freq: Four times a day (QID) | ORAL | 0 refills | Status: AC | PRN

## 2024-11-05 MED ORDER — PROPOFOL 1000 MG/100ML IV EMUL
INTRAVENOUS | Status: AC
  Filled 2024-11-05: qty 100

## 2024-11-05 MED ORDER — PROPOFOL 500 MG/50ML IV EMUL
INTRAVENOUS | Status: DC | PRN
  Administered 2024-11-05: 140 ug/kg/min via INTRAVENOUS

## 2024-11-05 MED ORDER — TRIPLE ANTIBIOTIC 3.5-400-5000 EX OINT
TOPICAL_OINTMENT | CUTANEOUS | Status: DC | PRN
  Administered 2024-11-05: 1

## 2024-11-05 MED ORDER — PROPOFOL 10 MG/ML IV BOLUS
INTRAVENOUS | Status: DC | PRN
  Administered 2024-11-05 (×3): 20 mg via INTRAVENOUS
  Administered 2024-11-05: 30 mg via INTRAVENOUS

## 2024-11-05 MED ORDER — BUPIVACAINE HCL (PF) 0.5 % IJ SOLN
INTRAMUSCULAR | Status: AC
Start: 2024-11-05 — End: 2024-11-05
  Filled 2024-11-05: qty 30

## 2024-11-05 MED ORDER — OXYCODONE HCL 5 MG PO TABS: 5.0000 mg | ORAL_TABLET | Freq: Once | ORAL | Status: DC | PRN

## 2024-11-05 MED ORDER — 0.9 % SODIUM CHLORIDE (POUR BTL) OPTIME
TOPICAL | Status: DC | PRN
  Administered 2024-11-05: 1000 mL

## 2024-11-05 MED ORDER — BUPIVACAINE HCL (PF) 0.5 % IJ SOLN
INTRAMUSCULAR | Status: DC | PRN
  Administered 2024-11-05: 10 mL

## 2024-11-05 MED ORDER — LIDOCAINE HCL (PF) 2 % IJ SOLN
INTRAMUSCULAR | Status: AC
  Filled 2024-11-05: qty 5

## 2024-11-05 MED ORDER — ORAL CARE MOUTH RINSE: 15.0000 mL | Freq: Once | OROMUCOSAL | Status: AC

## 2024-11-05 MED ORDER — OXYCODONE HCL 5 MG/5ML PO SOLN: 5.0000 mg | Freq: Once | ORAL | Status: DC | PRN

## 2024-11-05 MED ORDER — FENTANYL CITRATE (PF) 100 MCG/2ML IJ SOLN
INTRAMUSCULAR | Status: DC | PRN
  Administered 2024-11-05 (×2): 50 ug via INTRAVENOUS

## 2024-11-05 MED ORDER — CHLORHEXIDINE GLUCONATE 0.12 % MT SOLN
15.0000 mL | Freq: Once | OROMUCOSAL | Status: AC
  Administered 2024-11-05: 15 mL via OROMUCOSAL

## 2024-11-05 MED ORDER — METHOCARBAMOL 750 MG PO TABS: 750.0000 mg | ORAL_TABLET | Freq: Four times a day (QID) | ORAL | 0 refills | Status: AC

## 2024-11-05 MED ORDER — CEFAZOLIN SODIUM-DEXTROSE 2-4 GM/100ML-% IV SOLN
2.0000 g | INTRAVENOUS | Status: AC
  Administered 2024-11-05: 2 g via INTRAVENOUS
  Filled 2024-11-05: qty 100

## 2024-11-05 MED ORDER — BACITRACIN-NEOMYCIN-POLYMYXIN OINTMENT TUBE
TOPICAL_OINTMENT | CUTANEOUS | Status: AC
Start: 2024-11-05 — End: 2024-11-05
  Filled 2024-11-05: qty 14.17

## 2024-11-05 MED ORDER — ONDANSETRON HCL 4 MG/2ML IJ SOLN
INTRAMUSCULAR | Status: AC
  Filled 2024-11-05: qty 2

## 2024-11-05 MED ORDER — FENTANYL CITRATE (PF) 100 MCG/2ML IJ SOLN
INTRAMUSCULAR | Status: AC
  Filled 2024-11-05: qty 2

## 2024-11-05 SURGICAL SUPPLY — 27 items
BLADE SURG 15 STRL LF DISP TIS (BLADE) IMPLANT
BNDG GAUZE DERMACEA FLUFF 4 (GAUZE/BANDAGES/DRESSINGS) ×2 IMPLANT
BRIEF MESH DISP LRG (UNDERPADS AND DIAPERS) ×2 IMPLANT
CLOTH BEACON ORANGE TIMEOUT ST (SAFETY) ×2 IMPLANT
COVER BACK TABLE 60X90IN (DRAPES) ×2 IMPLANT
COVER MAYO STAND STRL (DRAPES) ×2 IMPLANT
DEPRESSOR TONGUE 6 IN STERILE (GAUZE/BANDAGES/DRESSINGS) ×2 IMPLANT
DRAPE LAPAROTOMY T 98X78 PEDS (DRAPES) ×2 IMPLANT
GAUZE 4X4 16PLY ~~LOC~~+RFID DBL (SPONGE) ×2 IMPLANT
GAUZE SPONGE 4X4 12PLY STRL (GAUZE/BANDAGES/DRESSINGS) IMPLANT
GLOVE SURG LX STRL 8.0 MICRO (GLOVE) ×2 IMPLANT
GOWN STRL REUS W/TWL LRG LVL3 (GOWN DISPOSABLE) ×2 IMPLANT
GOWN STRL REUS W/TWL XL LVL3 (GOWN DISPOSABLE) ×2 IMPLANT
KIT BASIN OR (CUSTOM PROCEDURE TRAY) ×2 IMPLANT
KIT TURNOVER KIT A (KITS) ×2 IMPLANT
MANIFOLD NEPTUNE II (INSTRUMENTS) ×2 IMPLANT
NDL HYPO 22X1.5 SAFETY MO (MISCELLANEOUS) IMPLANT
NEEDLE HYPO 22X1.5 SAFETY MO (MISCELLANEOUS) ×1 IMPLANT
PENCIL SMOKE EVACUATOR (MISCELLANEOUS) IMPLANT
SLEEVE SCD COMPRESS KNEE MED (STOCKING) ×2 IMPLANT
SUPPORTER AHLETIC TETRA LG (SOFTGOODS) IMPLANT
SUT MNCRL AB 4-0 PS2 18 (SUTURE) IMPLANT
SYR CONTROL 10ML LL (SYRINGE) IMPLANT
TOWEL OR 17X24 6PK STRL BLUE (TOWEL DISPOSABLE) ×4 IMPLANT
TUBE CONNECTING 12X1/4 (SUCTIONS) IMPLANT
VACUUM HOSE 7/8X10 W/ WAND (MISCELLANEOUS) ×2 IMPLANT
YANKAUER SUCT BULB TIP 10FT TU (MISCELLANEOUS) IMPLANT

## 2024-11-05 NOTE — Transfer of Care (Signed)
 Immediate Anesthesia Transfer of Care Note  Patient: Scott Schaefer  Procedure(s) Performed: REMOVAL, CONDYLOMA (Perineum)  Patient Location: PACU  Anesthesia Type:MAC  Level of Consciousness: awake, alert , oriented, and patient cooperative  Airway & Oxygen Therapy: Patient Spontanous Breathing  Post-op Assessment: Report given to RN and Post -op Vital signs reviewed and stable  Post vital signs: Reviewed and stable  Last Vitals:  Vitals Value Taken Time  BP 115/77 11/05/24 15:54  Temp    Pulse 66 11/05/24 15:57  Resp 14 11/05/24 15:57  SpO2 95 % 11/05/24 15:57  Vitals shown include unfiled device data.  Last Pain:  Vitals:   11/05/24 1200  TempSrc: Oral  PainSc: 0-No pain         Complications: No notable events documented.

## 2024-11-05 NOTE — Anesthesia Postprocedure Evaluation (Signed)
 Anesthesia Post Note  Patient: Scott Schaefer  Procedure(s) Performed: REMOVAL, CONDYLOMA (Perineum)     Patient location during evaluation: PACU Anesthesia Type: MAC Level of consciousness: awake and alert Pain management: pain level controlled Vital Signs Assessment: post-procedure vital signs reviewed and stable Respiratory status: spontaneous breathing, nonlabored ventilation, respiratory function stable and patient connected to nasal cannula oxygen Cardiovascular status: stable and blood pressure returned to baseline Postop Assessment: no apparent nausea or vomiting Anesthetic complications: no   No notable events documented.  Last Vitals:  Vitals:   11/05/24 1615 11/05/24 1630  BP: 138/83 (!) 153/91  Pulse: 68 62  Resp: 15 17  Temp:  36.4 C  SpO2: 94% 98%    Last Pain:  Vitals:   11/05/24 1630  TempSrc:   PainSc: 0-No pain                 Rome Ade

## 2024-11-05 NOTE — Op Note (Signed)
 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 Steffan Pea  Procedure performed: Excision of condyloma 3 cm in size  Operative findings: 2 flat like condyloma lesions on the ventral side of the penis but excised and sent for pathology  Anesthesia: MAC  Antibiotics: Ancef   EBL: Minimal  UOP: Not applicable  Specimens: 2 condylomas large and small  Indication for procedure: Mr. Scott Schaefer is a 63 year old male with previous history of HPV warts he was previously treated this year but had 2 small lesions that come back he is presenting today for removal of those condyloma lesions.  Procedure in detail: After informed consent was confirmed in the preoperative area patient taken the operative suite placed under MAC anesthesia he was then prepped and draped in usual sterile fashion timeout was performed verifying correct patient procedure.  First a penile block was done using half percent Marcaine .  Next the 2 lesions were identified on the ventral side of the penis they were outlined using a marker and then a 15 blade was used to cut just the dermis of the skin using the Bovie the dermis was gently removed once the dermis removed in both locations the incisions were closed using 4-0 Monocryl suture in a running fashion incisions were made elliptical prior to that so that would be easy closures.  Once area was closed bacitracin  placed over the incision patient tolerated the procedure well.  Patient patient was woken up in the operative suite and taken the PACU in stable condition  Disposition: Patient will follow-up in 3 months

## 2024-11-05 NOTE — Anesthesia Preprocedure Evaluation (Signed)
 Anesthesia Evaluation  Patient identified by MRN, date of birth, ID band Patient awake    Reviewed: Allergy & Precautions, NPO status , Patient's Chart, lab work & pertinent test results, reviewed documented beta blocker date and time   History of Anesthesia Complications Negative for: history of anesthetic complications  Airway Mallampati: II  TM Distance: >3 FB Neck ROM: Full    Dental  (+) Missing, Dental Advisory Given   Pulmonary sleep apnea (noncompliant with CPAP) , Current SmokerPatient did not abstain from smoking.   Pulmonary exam normal breath sounds clear to auscultation       Cardiovascular hypertension, Pt. on medications and Pt. on home beta blockers Normal cardiovascular exam Rhythm:Regular Rate:Normal     Neuro/Psych  PSYCHIATRIC DISORDERS Anxiety Depression    negative neurological ROS     GI/Hepatic Neg liver ROS,GERD  ,,  Endo/Other  diabetes, Type 2, Oral Hypoglycemic Agents    Renal/GU negative Renal ROS  negative genitourinary   Musculoskeletal  (+) Arthritis ,    Abdominal   Peds  Hematology negative hematology ROS (+)   Anesthesia Other Findings 63 yo male has a past medical history of HTN, OSA (noncompliant with CPAP), DM (A1c 6.5), GERD, arthritis, anxiety, depression. Hx of ACDF in 11/2023.   LD Trulicity:  > 7 days ago, denies GI symptoms today  Reproductive/Obstetrics                              Anesthesia Physical Anesthesia Plan  ASA: 2  Anesthesia Plan: MAC   Post-op Pain Management: Minimal or no pain anticipated   Induction: Intravenous  PONV Risk Score and Plan: 2 and Propofol  infusion, TIVA, Ondansetron , Midazolam  and Dexamethasone   Airway Management Planned: Nasal Cannula and Natural Airway  Additional Equipment: None  Intra-op Plan:   Post-operative Plan:   Informed Consent: I have reviewed the patients History and Physical, chart,  labs and discussed the procedure including the risks, benefits and alternatives for the proposed anesthesia with the patient or authorized representative who has indicated his/her understanding and acceptance.     Dental advisory given  Plan Discussed with: CRNA and Surgeon  Anesthesia Plan Comments: (Discussed risks of anesthesia with patient, including possibility of difficulty with spontaneous ventilation under anesthesia necessitating airway intervention, PONV, and rare risks such as cardiac or respiratory or neurological events, and allergic reactions. Discussed the role of CRNA in patient's perioperative care. Patient understands. Patient counseled on benefits of smoking cessation, and increased perioperative risks associated with continued smoking. )         Anesthesia Quick Evaluation

## 2024-11-05 NOTE — Discharge Instructions (Addendum)
 Postoperative instructions for Penile lesion excision.   Wound:  In most cases your incision will have absorbable sutures that run along the course of your incision and will dissolve within the first 10-20 days. Some will fall out even earlier. Expect some redness as the sutures dissolved but this should occur only around the sutures. If there is generalized redness, especially with increasing pain or swelling, let us  know. The penis will very likely get black and blue as the blood in the tissues spread. Sometimes the whole penis will turn colors. The black and blue is followed by a yellow and brown color. In time, all the discoloration will go away.  Diet:  You may return to your normal diet within 24 hours following your surgery. You may note some mild nausea and possibly vomiting the first 6-8 hours following surgery. This is usually due to the side effects of anesthesia, and will disappear quite soon. I would suggest clear liquids and a very light meal the first evening following your surgery.  Activity:  Your physical activity should be restricted the first 48 hours. During that time you should remain relatively inactive, moving about only when necessary. During the first 7-10 days following surgery he should avoid lifting any heavy objects (anything greater than 15 pounds), and avoid strenuous exercise. If you work, ask us  specifically about your restrictions, both for work and home. We will write a note to your employer if needed.  Avoid all sexual activity for 6 weeks   Ice packs can be placed on and off over the penis for the first 48 hours to help relieve the pain and keep the swelling down. Frozen peas or corn in a ZipLock bag can be frozen, used and re-frozen. Fifteen minutes on and 15 minutes off is a reasonable schedule.   Hygiene:  You may shower 48 hours after your surgery. Do not submerge your incision underwater for 2 weeks after surgery this includes baths, hot tubs, and  swimming.   Medication:  You will be sent home with some type of pain medication. In many cases you will be sent home with a narcotic pain pill (Vicodin or Tylox). If the pain is not too bad, you may take either Tylenol  (acetaminophen ) or Advil (ibuprofen) which contain no narcotic agents, and might be tolerated a little better, with fewer side effects. If the pain medication you are sent home with does not control the pain, you will have to let us  know. Some narcotic pain medications cannot be given or refilled by a phone call to a pharmacy.  Problems you should report to us :  Fever of 101.0 degrees Fahrenheit or greater. Moderate or severe swelling under the skin incision or involving the scrotum. Drug reaction such as hives, a rash, nausea or vomiting.

## 2024-11-06 ENCOUNTER — Encounter (HOSPITAL_COMMUNITY): Payer: Self-pay | Admitting: Urology

## 2024-11-09 LAB — SURGICAL PATHOLOGY
